# Patient Record
Sex: Female | Born: 1985 | Race: White | Hispanic: No | Marital: Single | Smoking: Current every day smoker
Health system: Southern US, Community
[De-identification: ages and names within clinical notes are randomized; demographics above are authoritative.]

## PROBLEM LIST (undated history)

## (undated) DIAGNOSIS — C73 Malignant neoplasm of thyroid gland: Secondary | ICD-10-CM

## (undated) HISTORY — PX: THYROIDECTOMY, PARTIAL: SHX18

---

## 2011-01-10 ENCOUNTER — Emergency Department (HOSPITAL_BASED_OUTPATIENT_CLINIC_OR_DEPARTMENT_OTHER)
Admission: EM | Admit: 2011-01-10 | Discharge: 2011-01-10 | Disposition: A | Discharge status: Home / Self Care | Payer: Self-pay | Attending: Emergency Medicine | Admitting: Emergency Medicine

## 2011-01-10 DIAGNOSIS — F17200 Nicotine dependence, unspecified, uncomplicated: Secondary | ICD-10-CM | POA: Insufficient documentation

## 2011-01-10 DIAGNOSIS — H6690 Otitis media, unspecified, unspecified ear: Secondary | ICD-10-CM | POA: Insufficient documentation

## 2011-01-10 DIAGNOSIS — N390 Urinary tract infection, site not specified: Secondary | ICD-10-CM | POA: Insufficient documentation

## 2011-01-10 LAB — URINALYSIS, ROUTINE W REFLEX MICROSCOPIC
Bilirubin Urine: NEGATIVE
Nitrite: POSITIVE — AB
Specific Gravity, Urine: 1.028 (ref 1.005–1.030)
Urobilinogen, UA: 0.2 mg/dL (ref 0.0–1.0)

## 2011-01-10 LAB — URINE MICROSCOPIC-ADD ON

## 2011-01-12 LAB — URINE CULTURE
Colony Count: >=100000
Culture  Setup Time: 201203290628

## 2011-04-25 ENCOUNTER — Emergency Department (HOSPITAL_BASED_OUTPATIENT_CLINIC_OR_DEPARTMENT_OTHER)
Admission: EM | Admit: 2011-04-25 | Discharge: 2011-04-25 | Disposition: A | Discharge status: Home / Self Care | Payer: PRIVATE HEALTH INSURANCE | Attending: Emergency Medicine | Admitting: Emergency Medicine

## 2011-04-25 ENCOUNTER — Encounter: Payer: Self-pay | Admitting: *Deleted

## 2011-04-25 DIAGNOSIS — S29019A Strain of muscle and tendon of unspecified wall of thorax, initial encounter: Secondary | ICD-10-CM

## 2011-04-25 DIAGNOSIS — X500XXA Overexertion from strenuous movement or load, initial encounter: Secondary | ICD-10-CM | POA: Insufficient documentation

## 2011-04-25 DIAGNOSIS — S239XXA Sprain of unspecified parts of thorax, initial encounter: Secondary | ICD-10-CM | POA: Insufficient documentation

## 2011-04-25 DIAGNOSIS — Y92009 Unspecified place in unspecified non-institutional (private) residence as the place of occurrence of the external cause: Secondary | ICD-10-CM | POA: Insufficient documentation

## 2011-04-25 MED ORDER — OXYCODONE-ACETAMINOPHEN 5-325 MG PO TABS: 1.0000 | ORAL_TABLET | ORAL | Status: AC | PRN

## 2011-04-25 MED ORDER — CYCLOBENZAPRINE HCL 10 MG PO TABS: 15.0000 mg | ORAL_TABLET | Freq: Three times a day (TID) | ORAL | Status: AC

## 2011-04-25 MED ORDER — CYCLOBENZAPRINE HCL 10 MG PO TABS
10.0000 mg | ORAL_TABLET | Freq: Once | ORAL | Status: AC
  Administered 2011-04-25: 10 mg via ORAL
  Filled 2011-04-25: qty 1

## 2011-04-25 MED ORDER — OXYCODONE-ACETAMINOPHEN 5-325 MG PO TABS
1.0000 | ORAL_TABLET | Freq: Once | ORAL | Status: AC
  Administered 2011-04-25: 1 via ORAL
  Filled 2011-04-25: qty 1

## 2011-04-25 NOTE — ED Provider Notes (Signed)
History     Chief Complaint  Patient presents with  . Back Pain   HPI Comments: Pt threw a large bag of garbage out on Sunday, 3 days ago and wrenched her back.  She has tried to take ibuprofen and use a heating pad without relief.  The pain is localized in her midback.  Patient is a 25 y.o. female presenting with back pain. The history is provided by the patient. No language interpreter was used.  Back Pain  This is a new problem. The current episode started 12 to 24 hours ago. The problem occurs constantly. The problem has not changed since onset.The pain is associated with lifting heavy objects. The pain is present in the thoracic spine. The quality of the pain is described as stabbing. The pain does not radiate. The pain is severe. The symptoms are aggravated by bending and twisting. She has tried analgesics and heat for the symptoms. The treatment provided no relief.    History reviewed. No pertinent past medical history.  History reviewed. No pertinent past surgical history.  History reviewed. No pertinent family history.  History  Substance Use Topics  . Smoking status: Current Everyday Smoker -- 0.5 packs/day  . Smokeless tobacco: Never Used  . Alcohol Use: No    OB History    Grav Para Term Preterm Abortions TAB SAB Ect Mult Living                  Review of Systems  Constitutional: Negative.   HENT: Negative.   Eyes: Negative.   Respiratory: Negative.   Cardiovascular: Negative.   Gastrointestinal: Negative.   Genitourinary: Negative.   Musculoskeletal: Positive for back pain.  Skin: Negative.   Neurological: Negative.   Psychiatric/Behavioral: Negative.     Physical Exam  BP 144/79  Pulse 76  Temp(Src) 98.1 F (36.7 C) (Oral)  Resp 20  Wt 240 lb (108.863 kg)  SpO2 99%  Physical Exam  Constitutional: She is oriented to person, place, and time. She appears well-developed and well-nourished. She appears distressed.  HENT:  Head: Normocephalic and  atraumatic.  Right Ear: External ear normal.  Left Ear: External ear normal.  Eyes: Conjunctivae and EOM are normal. Pupils are equal, round, and reactive to light.  Neck: Normal range of motion. Neck supple.  Cardiovascular: Normal rate and regular rhythm.   Pulmonary/Chest: Effort normal and breath sounds normal.  Abdominal: Soft. Bowel sounds are normal.  Musculoskeletal:       She has pain localized to the lower thoracic paraspinous muscles.  There is no bony deformity.  Neurological: She is oriented to person, place, and time.  Skin: Skin is warm and dry.  Psychiatric: She has a normal mood and affect. Her behavior is normal.    ED Course  Procedures  MDM  Pt treated for back pain with Percocet for pain and Flexeril as muscle relaxant.  Mechanism of injury did not suggest the need for imaging.    Carleene Cooper III, MD 04/25/11 2136

## 2014-11-03 ENCOUNTER — Encounter (HOSPITAL_BASED_OUTPATIENT_CLINIC_OR_DEPARTMENT_OTHER): Payer: Self-pay | Admitting: *Deleted

## 2014-11-03 ENCOUNTER — Emergency Department (HOSPITAL_BASED_OUTPATIENT_CLINIC_OR_DEPARTMENT_OTHER)
Admission: EM | Admit: 2014-11-03 | Discharge: 2014-11-03 | Disposition: A | Discharge status: Home / Self Care | Payer: Medicaid Other | Attending: Emergency Medicine | Admitting: Emergency Medicine

## 2014-11-03 DIAGNOSIS — J029 Acute pharyngitis, unspecified: Secondary | ICD-10-CM | POA: Diagnosis present

## 2014-11-03 DIAGNOSIS — Z85850 Personal history of malignant neoplasm of thyroid: Secondary | ICD-10-CM | POA: Diagnosis not present

## 2014-11-03 DIAGNOSIS — J020 Streptococcal pharyngitis: Secondary | ICD-10-CM | POA: Diagnosis not present

## 2014-11-03 DIAGNOSIS — Z720 Tobacco use: Secondary | ICD-10-CM | POA: Diagnosis not present

## 2014-11-03 DIAGNOSIS — Z79899 Other long term (current) drug therapy: Secondary | ICD-10-CM | POA: Diagnosis not present

## 2014-11-03 DIAGNOSIS — M791 Myalgia: Secondary | ICD-10-CM | POA: Diagnosis not present

## 2014-11-03 HISTORY — DX: Malignant neoplasm of thyroid gland: C73

## 2014-11-03 LAB — RAPID STREP SCREEN (MED CTR MEBANE ONLY): STREPTOCOCCUS, GROUP A SCREEN (DIRECT): POSITIVE — AB

## 2014-11-03 MED ORDER — PENICILLIN G BENZATHINE 1200000 UNIT/2ML IM SUSP
1.2000 10*6.[IU] | Freq: Once | INTRAMUSCULAR | Status: AC
  Administered 2014-11-03: 1.2 10*6.[IU] via INTRAMUSCULAR
  Filled 2014-11-03: qty 2

## 2014-11-03 NOTE — Discharge Instructions (Signed)

## 2014-11-03 NOTE — ED Notes (Signed)
Pt c/o sore throat x 1 day- exposed to strep on Sunday

## 2014-11-03 NOTE — ED Provider Notes (Signed)
CSN: [id number]     Arrival date & time 11/03/14  1022 History   First MD Initiated Contact with Patient 11/03/14 1136     Chief Complaint  Patient presents with  . Sore Throat     (Consider location/radiation/quality/duration/timing/severity/associated sxs/prior Treatment) HPI Comments: Patient presents with sore throat. She had a recent exposure to strep throat in complains of a two-day history of a sore throat and myalgias. She's had some subjective fevers. She has a little bit of runny nose and postnasal drip. She denies any shortness of breath. She denies any vomiting or diarrhea.  Patient is a 29 y.o. female presenting with pharyngitis.  Sore Throat Pertinent negatives include no chest pain, no abdominal pain, no headaches and no shortness of breath.    Past Medical History  Diagnosis Date  . Thyroid cancer    Past Surgical History  Procedure Laterality Date  . Thyroidectomy, partial     No family history on file. History  Substance Use Topics  . Smoking status: Current Every Day Smoker -- 0.50 packs/day    Types: Cigarettes  . Smokeless tobacco: Never Used  . Alcohol Use: No   OB History    No data available     Review of Systems  Constitutional: Positive for fatigue. Negative for fever, chills and diaphoresis.  HENT: Positive for congestion, postnasal drip and sore throat. Negative for rhinorrhea and sneezing.   Eyes: Negative.   Respiratory: Negative for cough, chest tightness and shortness of breath.   Cardiovascular: Negative for chest pain and leg swelling.  Gastrointestinal: Negative for nausea, vomiting, abdominal pain, diarrhea and blood in stool.  Genitourinary: Negative for frequency, hematuria, flank pain and difficulty urinating.  Musculoskeletal: Positive for myalgias and back pain. Negative for arthralgias.  Skin: Negative for rash.  Neurological: Negative for dizziness, speech difficulty, weakness, numbness and headaches.      Allergies   Review of patient's allergies indicates no known allergies.  Home Medications   Prior to Admission medications   Medication Sig Start Date End Date Taking? Authorizing Provider  levothyroxine (SYNTHROID, LEVOTHROID) 112 MCG tablet Take 112 mcg by mouth daily before breakfast.   Yes Historical Provider, MD  methadone (DOLOPHINE) 10 MG tablet Take 95 mg by mouth daily.   Yes Historical Provider, MD   BP 134/71 mmHg  Pulse 88  Temp(Src) 98.3 F (36.8 C) (Oral)  Resp 16  Ht 5\' 2"  (1.575 m)  Wt 271 lb (122.925 kg)  BMI 49.55 kg/m2  SpO2 100% Physical Exam  Constitutional: She is oriented to person, place, and time. She appears well-developed and well-nourished.  HENT:  Head: Normocephalic and atraumatic.  Mouth/Throat: Oropharynx is clear and moist. No oropharyngeal exudate.  Eyes: Pupils are equal, round, and reactive to light.  Neck: Normal range of motion. Neck supple.  Cardiovascular: Normal rate, regular rhythm and normal heart sounds.   Pulmonary/Chest: Effort normal and breath sounds normal. No respiratory distress. She has no wheezes. She has no rales. She exhibits no tenderness.  Abdominal: Soft. Bowel sounds are normal. There is no tenderness. There is no rebound and no guarding.  Musculoskeletal: Normal range of motion. She exhibits no edema.  Lymphadenopathy:    She has cervical adenopathy.  Neurological: She is alert and oriented to person, place, and time.  Skin: Skin is warm and dry. No rash noted.  Psychiatric: She has a normal mood and affect.    ED Course  Procedures (including critical care time) Labs Review Labs Reviewed  RAPID STREP SCREEN - Abnormal; Notable for the following:    Streptococcus, Group A Screen (Direct) POSITIVE (*)    All other components within normal limits    Imaging Review No results found.   EKG Interpretation None      MDM   Final diagnoses:  Pharyngitis due to group A beta hemolytic Streptococci    Patient is  well-appearing with no airway compromise. There is no evidence of peritonsillar abscess. She was treated with Bicillin. She was given return precautions.    Malvin Johns, MD 11/03/14 1328

## 2014-11-03 NOTE — ED Notes (Signed)
Pt d/c with ride- no new rx given

## 2014-11-03 NOTE — ED Notes (Signed)
MD at bedside. 

## 2015-02-16 ENCOUNTER — Encounter (HOSPITAL_COMMUNITY): Payer: Self-pay

## 2015-02-16 ENCOUNTER — Emergency Department (HOSPITAL_COMMUNITY): Payer: Medicaid Other

## 2015-02-16 ENCOUNTER — Emergency Department (HOSPITAL_COMMUNITY)
Admission: EM | Admit: 2015-02-16 | Discharge: 2015-02-16 | Disposition: A | Discharge status: Home / Self Care | Payer: Medicaid Other | Attending: Emergency Medicine | Admitting: Emergency Medicine

## 2015-02-16 DIAGNOSIS — Z3202 Encounter for pregnancy test, result negative: Secondary | ICD-10-CM | POA: Diagnosis not present

## 2015-02-16 DIAGNOSIS — R1084 Generalized abdominal pain: Secondary | ICD-10-CM | POA: Diagnosis present

## 2015-02-16 DIAGNOSIS — Z79899 Other long term (current) drug therapy: Secondary | ICD-10-CM | POA: Insufficient documentation

## 2015-02-16 DIAGNOSIS — N390 Urinary tract infection, site not specified: Secondary | ICD-10-CM | POA: Insufficient documentation

## 2015-02-16 DIAGNOSIS — R109 Unspecified abdominal pain: Secondary | ICD-10-CM

## 2015-02-16 DIAGNOSIS — Z720 Tobacco use: Secondary | ICD-10-CM | POA: Diagnosis not present

## 2015-02-16 DIAGNOSIS — Z85850 Personal history of malignant neoplasm of thyroid: Secondary | ICD-10-CM | POA: Diagnosis not present

## 2015-02-16 DIAGNOSIS — R197 Diarrhea, unspecified: Secondary | ICD-10-CM | POA: Insufficient documentation

## 2015-02-16 LAB — CBC WITH DIFFERENTIAL/PLATELET
BASOS ABS: 0 10*3/uL (ref 0.0–0.1)
BASOS PCT: 1 % (ref 0–1)
Eosinophils Absolute: 0.3 10*3/uL (ref 0.0–0.7)
Eosinophils Relative: 4 % (ref 0–5)
HEMATOCRIT: 39.1 % (ref 36.0–46.0)
Hemoglobin: 12.3 g/dL (ref 12.0–15.0)
Lymphocytes Relative: 28 % (ref 12–46)
Lymphs Abs: 1.7 10*3/uL (ref 0.7–4.0)
MCH: 24.7 pg — ABNORMAL LOW (ref 26.0–34.0)
MCHC: 31.5 g/dL (ref 30.0–36.0)
MCV: 78.5 fL (ref 78.0–100.0)
MONO ABS: 0.4 10*3/uL (ref 0.1–1.0)
Monocytes Relative: 6 % (ref 3–12)
NEUTROS ABS: 3.8 10*3/uL (ref 1.7–7.7)
NEUTROS PCT: 61 % (ref 43–77)
PLATELETS: 321 10*3/uL (ref 150–400)
RBC: 4.98 MIL/uL (ref 3.87–5.11)
RDW: 14.6 % (ref 11.5–15.5)
WBC: 6.2 10*3/uL (ref 4.0–10.5)

## 2015-02-16 LAB — URINALYSIS, ROUTINE W REFLEX MICROSCOPIC
GLUCOSE, UA: NEGATIVE mg/dL
Hgb urine dipstick: NEGATIVE
KETONES UR: NEGATIVE mg/dL
Nitrite: POSITIVE — AB
PROTEIN: NEGATIVE mg/dL
Specific Gravity, Urine: 1.022 (ref 1.005–1.030)
Urobilinogen, UA: 0.2 mg/dL (ref 0.0–1.0)
pH: 5.5 (ref 5.0–8.0)

## 2015-02-16 LAB — COMPREHENSIVE METABOLIC PANEL
ALBUMIN: 3.9 g/dL (ref 3.5–5.0)
ALT: 164 U/L — ABNORMAL HIGH (ref 14–54)
ANION GAP: 9 (ref 5–15)
AST: 93 U/L — AB (ref 15–41)
Alkaline Phosphatase: 187 U/L — ABNORMAL HIGH (ref 38–126)
BILIRUBIN TOTAL: 3.4 mg/dL — AB (ref 0.3–1.2)
BUN: 11 mg/dL (ref 6–20)
CHLORIDE: 105 mmol/L (ref 101–111)
CO2: 24 mmol/L (ref 22–32)
CREATININE: 0.77 mg/dL (ref 0.44–1.00)
Calcium: 9.3 mg/dL (ref 8.9–10.3)
GFR calc Af Amer: >60 mL/min (ref >60)
Glucose, Bld: 110 mg/dL — ABNORMAL HIGH (ref 70–99)
POTASSIUM: 3.8 mmol/L (ref 3.5–5.1)
Sodium: 138 mmol/L (ref 135–145)
TOTAL PROTEIN: 8.8 g/dL — AB (ref 6.5–8.1)

## 2015-02-16 LAB — POC URINE PREG, ED: PREG TEST UR: NEGATIVE

## 2015-02-16 LAB — URINE MICROSCOPIC-ADD ON

## 2015-02-16 LAB — I-STAT TROPONIN, ED: TROPONIN I, POC: 0 ng/mL (ref 0.00–0.08)

## 2015-02-16 LAB — LIPASE, BLOOD: Lipase: 20 U/L — ABNORMAL LOW (ref 22–51)

## 2015-02-16 IMAGING — CT CT ABD-PELV W/ CM
2 of 4 series · 16 of 46 positions shown, 18 images · IV contrast (OMNIPAQUE 300)
Comparison: None.

CLINICAL DATA: 28-year-old female with generalized abdominal pain
and diarrhea for 6 days. Initial encounter.

EXAM:
CT ABDOMEN AND PELVIS WITH CONTRAST
TECHNIQUE: Multidetector CT imaging of the abdomen and pelvis was performed
using the standard protocol following bolus administration of
intravenous contrast.
CONTRAST:  100mL OMNIPAQUE IOHEXOL 300 MG/ML  SOLN

[Series 2: abd/pel with · axial · 0.74mm/px · z∈[-466,-71]mm · 13 of 87 slices shown, 15 images]
[im 4/87  soft-tissue]
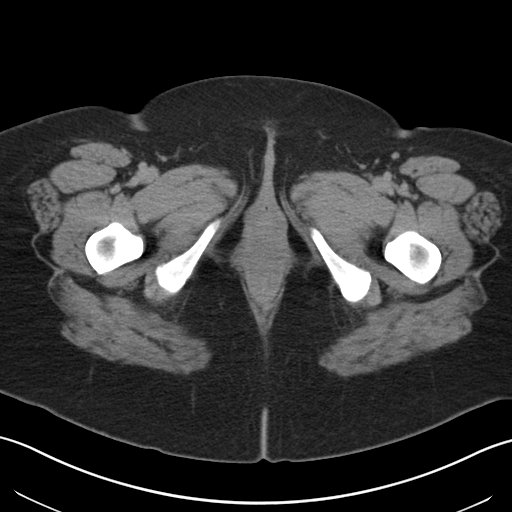
[im 4/87  bone]
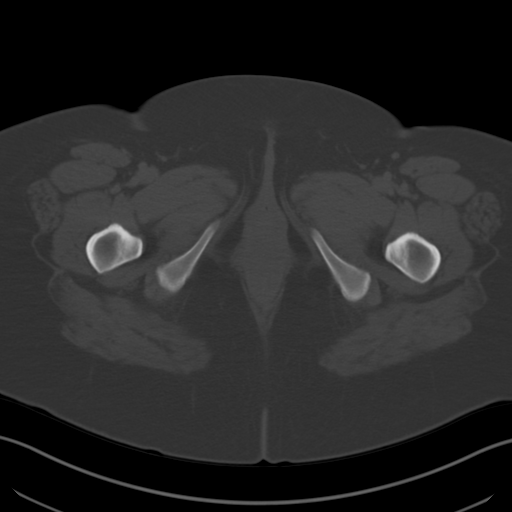
[im 11/87  soft-tissue]
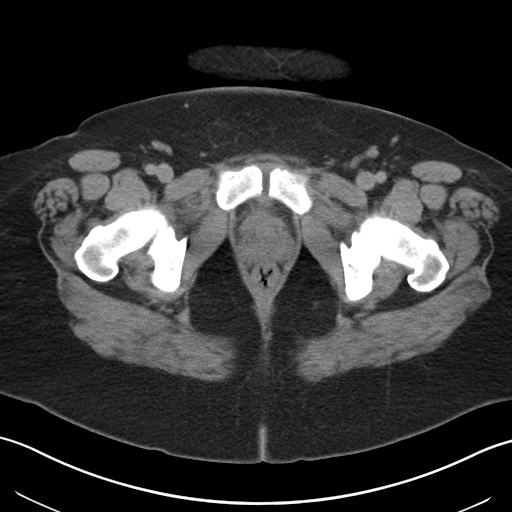
[im 18/87  soft-tissue]
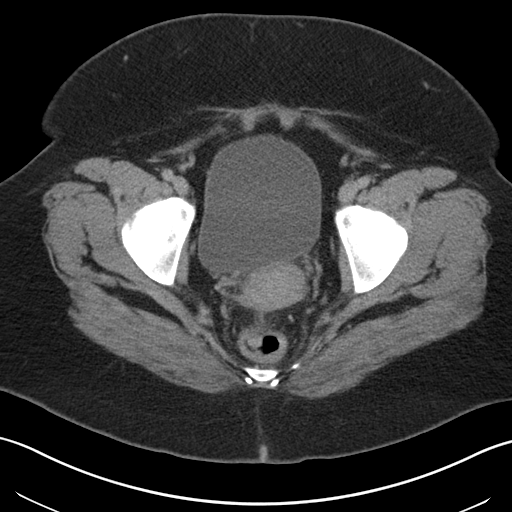
[im 26/87  soft-tissue]
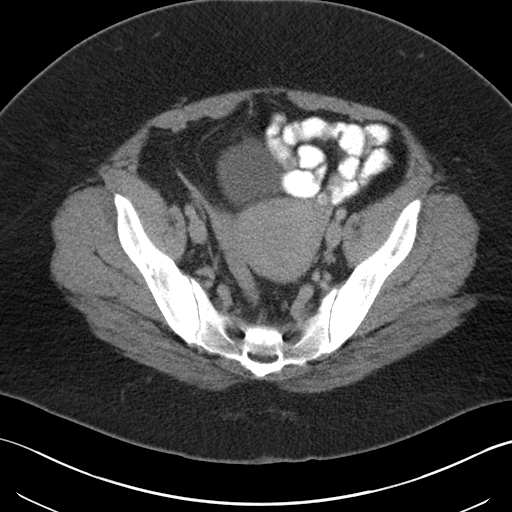
[im 29/87  soft-tissue]
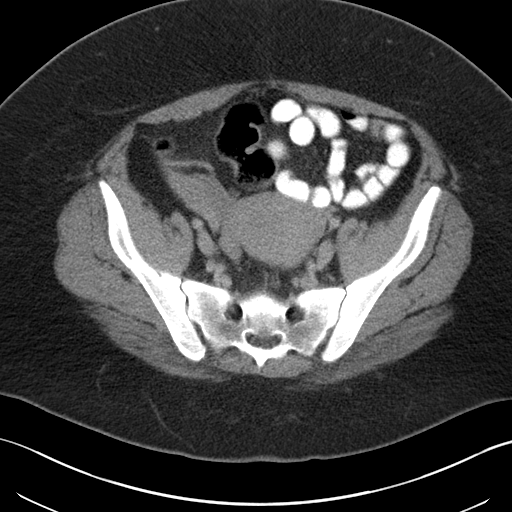
[im 36/87  soft-tissue]
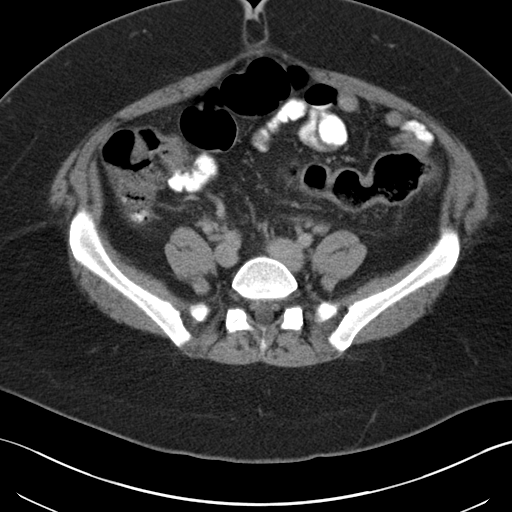
[im 44/87  soft-tissue]
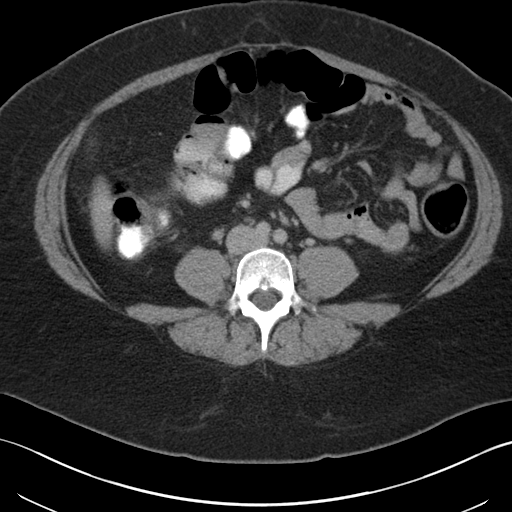
[im 51/87  soft-tissue]
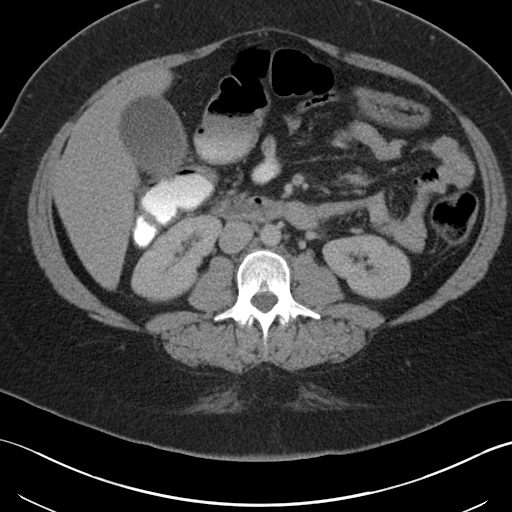
[im 58/87  soft-tissue]
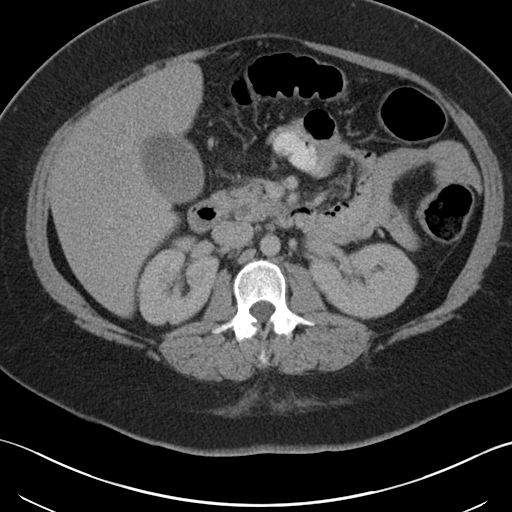
[im 58/87  bone]
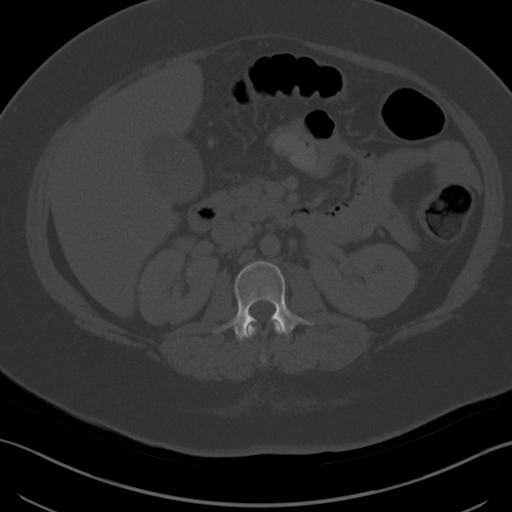
[im 61/87  soft-tissue]
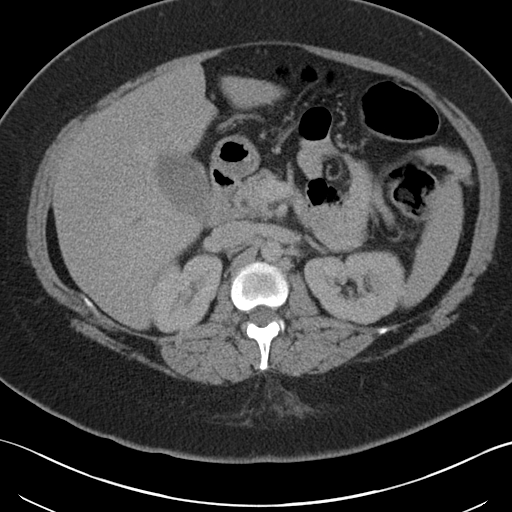
[im 69/87  soft-tissue]
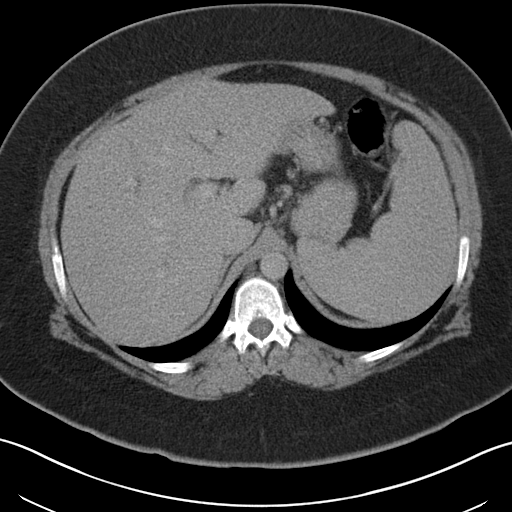
[im 76/87  soft-tissue]
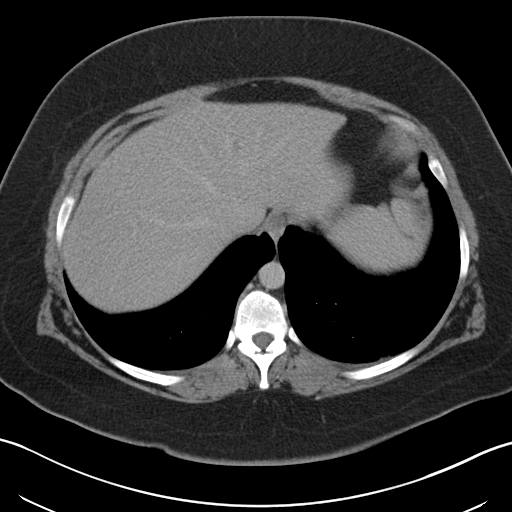
[im 83/87  soft-tissue]
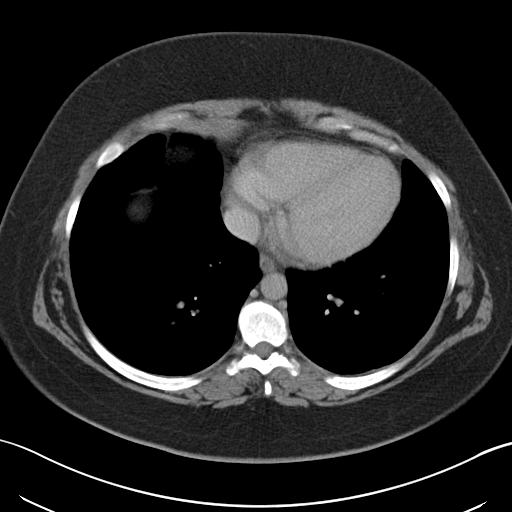

[Series 3: coronal a/|p · coronal · 0.62mm/px · 3 of 106 slices shown]
[im 36/106  soft-tissue]
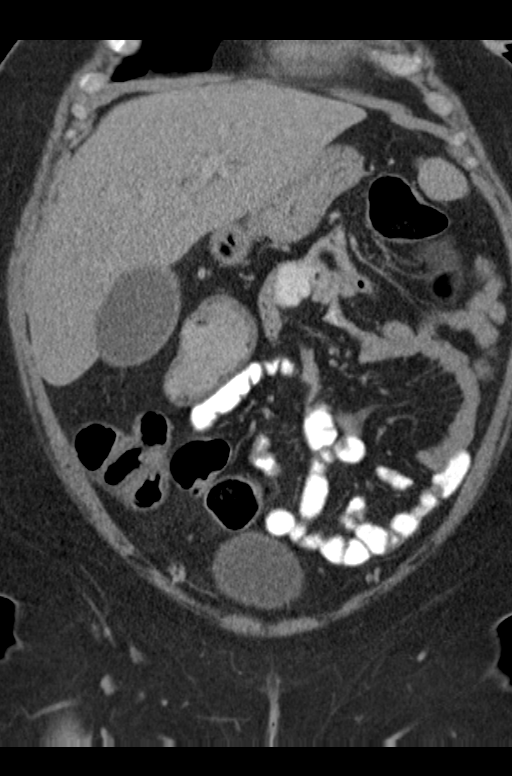
[im 47/106  soft-tissue]
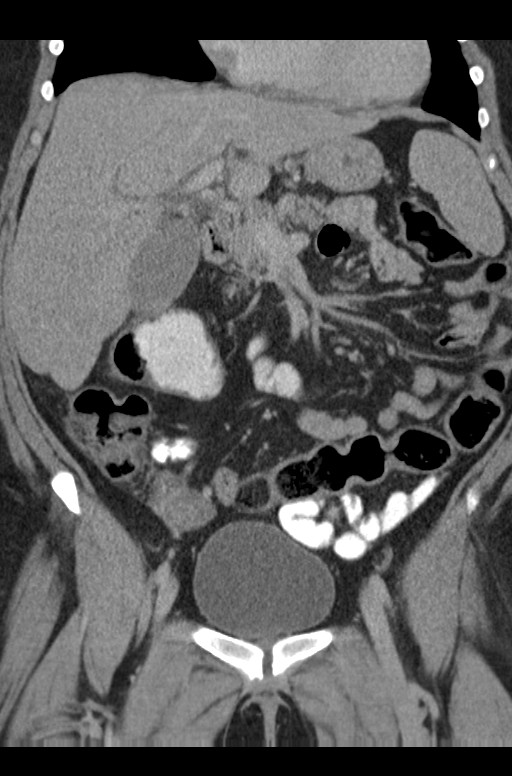
[im 59/106  soft-tissue]
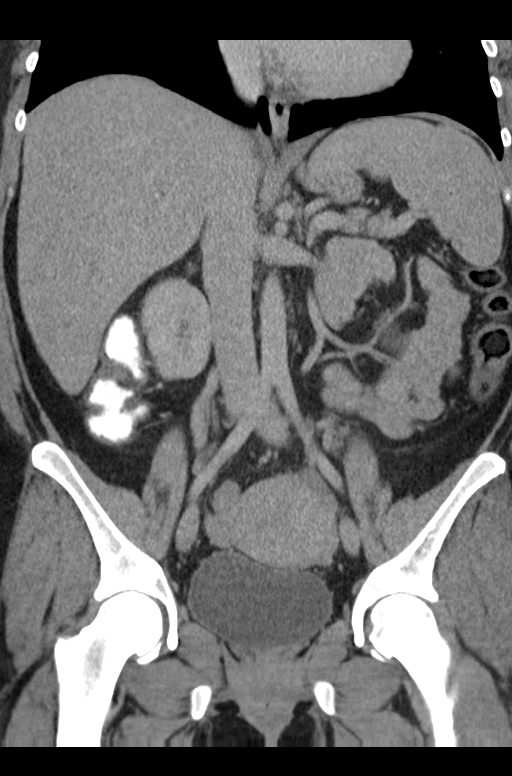

[16 of 46 positions shown; findings below may reference images not displayed]

FINDINGS: Large body habitus.

Negative lung bases.  No pericardial or pleural effusion.

No osseous abnormality identified.

No pelvic free fluid. Negative uterus and adnexa. Decompressed
rectum. Unremarkable bladder.

Redundant sigmoid colon extending above the umbilicus, and mildly
distended with gas and stool but otherwise negative.

Low-density stool continues into the left colon which is otherwise
negative. Negative transverse colon. Oral contrast has reached the
hepatic flexure. Negative right colon and appendix. Negative
terminal ileum. No dilated or inflamed small bowel loops.
Decompressed stomach and duodenum.

Liver, gallbladder, spleen, pancreas and adrenal glands are within
normal limits. Suboptimal intravascular contrast timing, no large
vessel thrombosis is evident. No abdominal free fluid. No
nephrolithiasis, hydronephrosis or perinephric stranding. Negative
course of both ureters. No lymphadenopathy.
IMPRESSION: No acute or inflammatory process identified in the abdomen or
pelvis. Normal appendix.

## 2015-02-16 MED ORDER — HYDROCODONE-ACETAMINOPHEN 5-325 MG PO TABS
1.0000 | ORAL_TABLET | Freq: Four times a day (QID) | ORAL | Status: DC | PRN
Start: 2015-02-16 — End: 2015-12-19

## 2015-02-16 MED ORDER — CIPROFLOXACIN HCL 500 MG PO TABS: 500.0000 mg | ORAL_TABLET | Freq: Two times a day (BID) | ORAL | Status: DC

## 2015-02-16 MED ORDER — IOHEXOL 300 MG/ML  SOLN
100.0000 mL | Freq: Once | INTRAMUSCULAR | Status: AC | PRN
  Administered 2015-02-16: 100 mL via INTRAVENOUS

## 2015-02-16 MED ORDER — LOPERAMIDE HCL 2 MG PO TABS: 2.0000 mg | ORAL_TABLET | Freq: Four times a day (QID) | ORAL | Status: AC | PRN

## 2015-02-16 MED ORDER — DEXTROSE 5 % IV SOLN
1.0000 g | Freq: Once | INTRAVENOUS | Status: AC
  Administered 2015-02-16: 1 g via INTRAVENOUS
  Filled 2015-02-16: qty 10

## 2015-02-16 MED ORDER — SODIUM CHLORIDE 0.9 % IV SOLN
INTRAVENOUS | Status: DC
  Administered 2015-02-16: 14:00:00 via INTRAVENOUS

## 2015-02-16 MED ORDER — SODIUM CHLORIDE 0.9 % IV BOLUS (SEPSIS)
1000.0000 mL | Freq: Once | INTRAVENOUS | Status: AC
  Administered 2015-02-16: 1000 mL via INTRAVENOUS

## 2015-02-16 MED ORDER — IOHEXOL 300 MG/ML  SOLN
50.0000 mL | Freq: Once | INTRAMUSCULAR | Status: AC | PRN
  Administered 2015-02-16: 50 mL via ORAL

## 2015-02-16 NOTE — ED Provider Notes (Signed)
CSN: [id number]     Arrival date & time 02/16/15  0908 History   First MD Initiated Contact with Patient 02/16/15 [phone]     Chief Complaint  Patient presents with  . Abdominal Pain  . Diarrhea     (Consider location/radiation/quality/duration/timing/severity/associated sxs/prior Treatment) Patient is a 29 y.o. female presenting with abdominal pain and diarrhea. The history is provided by the patient.  Abdominal Pain Associated symptoms: chills, diarrhea and fever   Associated symptoms: no chest pain, no dysuria, no nausea, no shortness of breath and no vomiting   Diarrhea Associated symptoms: abdominal pain, chills and fever   Associated symptoms: no headaches and no vomiting    patient with onset of generalized abdominal pain and diarrhea. No blood in the bowel movements. Patient states that we pain is 8 out of 10 ache in nature. Patient without any nausea or vomiting has had some fever or chills-like feelings.  Past Medical History  Diagnosis Date  . Thyroid cancer    Past Surgical History  Procedure Laterality Date  . Thyroidectomy, partial     History reviewed. No pertinent family history. History  Substance Use Topics  . Smoking status: Current Every Day Smoker -- 0.50 packs/day    Types: Cigarettes  . Smokeless tobacco: Never Used  . Alcohol Use: No   OB History    No data available     Review of Systems  Constitutional: Positive for fever and chills.  HENT: Negative for congestion.   Eyes: Negative for visual disturbance.  Respiratory: Negative for shortness of breath.   Cardiovascular: Negative for chest pain.  Gastrointestinal: Positive for abdominal pain and diarrhea. Negative for nausea and vomiting.  Genitourinary: Negative for dysuria.  Musculoskeletal: Negative for back pain.  Skin: Negative for rash.  Neurological: Negative for headaches.  Hematological: Does not bruise/bleed easily.  Psychiatric/Behavioral: Negative for confusion.      Allergies   Review of patient's allergies indicates no known allergies.  Home Medications   Prior to Admission medications   Medication Sig Start Date End Date Taking? Authorizing Provider  levothyroxine (SYNTHROID, LEVOTHROID) 112 MCG tablet Take 112 mcg by mouth daily before breakfast.   Yes Historical Provider, MD  methadone (DOLOPHINE) 10 MG/5ML solution Take 90 mg by mouth daily.   Yes Historical Provider, MD  ciprofloxacin (CIPRO) 500 MG tablet Take 1 tablet (500 mg total) by mouth 2 (two) times daily. 02/16/15   Fredia Sorrow, MD  HYDROcodone-acetaminophen (NORCO/VICODIN) 5-325 MG per tablet Take 1-2 tablets by mouth every 6 (six) hours as needed for moderate pain. 02/16/15   Fredia Sorrow, MD  loperamide (IMODIUM A-D) 2 MG tablet Take 1 tablet (2 mg total) by mouth 4 (four) times daily as needed for diarrhea or loose stools. 02/16/15   Fredia Sorrow, MD   BP 131/77 mmHg  Pulse 66  Temp(Src) 98.3 F (36.8 C) (Oral)  Resp 18  SpO2 98%  LMP 01/18/2015 (Approximate) Physical Exam  Constitutional: She is oriented to person, place, and time. She appears well-developed and well-nourished.  HENT:  Head: Normocephalic and atraumatic.  Mouth/Throat: Oropharynx is clear and moist.  Eyes: Conjunctivae and EOM are normal. Pupils are equal, round, and reactive to light.  Neck: Normal range of motion.  Cardiovascular: Normal rate, regular rhythm and normal heart sounds.   No murmur heard. Pulmonary/Chest: Effort normal and breath sounds normal. No respiratory distress.  Abdominal: Soft. Bowel sounds are normal. There is no tenderness.  Musculoskeletal: Normal range of motion.  Neurological: She  is alert and oriented to person, place, and time. No cranial nerve deficit. She exhibits normal muscle tone. Coordination normal.  Skin: Skin is warm. No rash noted. No erythema.  Nursing note and vitals reviewed.   ED Course  Procedures (including critical care time) Labs Review Labs Reviewed  CBC WITH  DIFFERENTIAL/PLATELET - Abnormal; Notable for the following:    MCH 24.7 (*)    All other components within normal limits  COMPREHENSIVE METABOLIC PANEL - Abnormal; Notable for the following:    Glucose, Bld 110 (*)    Total Protein 8.8 (*)    AST 93 (*)    ALT 164 (*)    Alkaline Phosphatase 187 (*)    Total Bilirubin 3.4 (*)    All other components within normal limits  LIPASE, BLOOD - Abnormal; Notable for the following:    Lipase 20 (*)    All other components within normal limits  URINALYSIS, ROUTINE W REFLEX MICROSCOPIC - Abnormal; Notable for the following:    Color, Urine ORANGE (*)    APPearance CLOUDY (*)    Bilirubin Urine LARGE (*)    Nitrite POSITIVE (*)    Leukocytes, UA MODERATE (*)    All other components within normal limits  URINE MICROSCOPIC-ADD ON - Abnormal; Notable for the following:    Squamous Epithelial / LPF MANY (*)    Bacteria, UA MANY (*)    All other components within normal limits  CLOSTRIDIUM DIFFICILE BY PCR  URINE CULTURE  I-STAT TROPOININ, ED  POC URINE PREG, ED   Results for orders placed or performed during the hospital encounter of 02/16/15  CBC with Differential  Result Value Ref Range   WBC 6.2 4.0 - 10.5 K/uL   RBC 4.98 3.87 - 5.11 MIL/uL   Hemoglobin 12.3 12.0 - 15.0 g/dL   HCT 39.1 36.0 - 46.0 %   MCV 78.5 78.0 - 100.0 fL   MCH 24.7 (L) 26.0 - 34.0 pg   MCHC 31.5 30.0 - 36.0 g/dL   RDW 14.6 11.5 - 15.5 %   Platelets 321 150 - 400 K/uL   Neutrophils Relative % 61 43 - 77 %   Neutro Abs 3.8 1.7 - 7.7 K/uL   Lymphocytes Relative 28 12 - 46 %   Lymphs Abs 1.7 0.7 - 4.0 K/uL   Monocytes Relative 6 3 - 12 %   Monocytes Absolute 0.4 0.1 - 1.0 K/uL   Eosinophils Relative 4 0 - 5 %   Eosinophils Absolute 0.3 0.0 - 0.7 K/uL   Basophils Relative 1 0 - 1 %   Basophils Absolute 0.0 0.0 - 0.1 K/uL  Comprehensive metabolic panel  Result Value Ref Range   Sodium 138 135 - 145 mmol/L   Potassium 3.8 3.5 - 5.1 mmol/L   Chloride 105 101  - 111 mmol/L   CO2 24 22 - 32 mmol/L   Glucose, Bld 110 (H) 70 - 99 mg/dL   BUN 11 6 - 20 mg/dL   Creatinine, Ser 0.77 0.44 - 1.00 mg/dL   Calcium 9.3 8.9 - 10.3 mg/dL   Total Protein 8.8 (H) 6.5 - 8.1 g/dL   Albumin 3.9 3.5 - 5.0 g/dL   AST 93 (H) 15 - 41 U/L   ALT 164 (H) 14 - 54 U/L   Alkaline Phosphatase 187 (H) 38 - 126 U/L   Total Bilirubin 3.4 (H) 0.3 - 1.2 mg/dL   GFR calc non Af Amer >60 >60 mL/min   GFR calc Af  Amer >60 >60 mL/min   Anion gap 9 5 - 15  Lipase, blood  Result Value Ref Range   Lipase 20 (L) 22 - 51 U/L  Urinalysis, Routine w reflex microscopic  Result Value Ref Range   Color, Urine ORANGE (A) YELLOW   APPearance CLOUDY (A) CLEAR   Specific Gravity, Urine 1.022 1.005 - 1.030   pH 5.5 5.0 - 8.0   Glucose, UA NEGATIVE NEGATIVE mg/dL   Hgb urine dipstick NEGATIVE NEGATIVE   Bilirubin Urine LARGE (A) NEGATIVE   Ketones, ur NEGATIVE NEGATIVE mg/dL   Protein, ur NEGATIVE NEGATIVE mg/dL   Urobilinogen, UA 0.2 0.0 - 1.0 mg/dL   Nitrite POSITIVE (A) NEGATIVE   Leukocytes, UA MODERATE (A) NEGATIVE  Urine microscopic-add on  Result Value Ref Range   Squamous Epithelial / LPF MANY (A) RARE   WBC, UA 7-10 <3 WBC/hpf   RBC / HPF 3-6 <3 RBC/hpf   Bacteria, UA MANY (A) RARE   Urine-Other MUCOUS PRESENT   I-stat troponin, ED (only if pt is 27 y.o. or older & pain is above umbilicus)  not at [facility]  Result Value Ref Range   Troponin i, poc 0.00 0.00 - 0.08 ng/mL   Comment 3          POC Urine Pregnancy, ED  (If Pre-menopausal female)  not at [facility]  Result Value Ref Range   Preg Test, Ur NEGATIVE NEGATIVE     Imaging Review Ct Abdomen Pelvis W Contrast  02/16/2015   CLINICAL DATA:  29 year old female with generalized abdominal pain and diarrhea for 6 days. Initial encounter.  EXAM: CT ABDOMEN AND PELVIS WITH CONTRAST  TECHNIQUE: Multidetector CT imaging of the abdomen and pelvis was performed using the standard protocol following bolus administration of  intravenous contrast.  CONTRAST:  166mL OMNIPAQUE IOHEXOL 300 MG/ML  SOLN  COMPARISON:  None.  FINDINGS: Large body habitus.  Negative lung bases.  No pericardial or pleural effusion.  No osseous abnormality identified.  No pelvic free fluid. Negative uterus and adnexa. Decompressed rectum. Unremarkable bladder.  Redundant sigmoid colon extending above the umbilicus, and mildly distended with gas and stool but otherwise negative.  Low-density stool continues into the left colon which is otherwise negative. Negative transverse colon. Oral contrast has reached the hepatic flexure. Negative right colon and appendix. Negative terminal ileum. No dilated or inflamed small bowel loops. Decompressed stomach and duodenum.  Liver, gallbladder, spleen, pancreas and adrenal glands are within normal limits. Suboptimal intravascular contrast timing, no large vessel thrombosis is evident. No abdominal free fluid. No nephrolithiasis, hydronephrosis or perinephric stranding. Negative course of both ureters. No lymphadenopathy.  IMPRESSION: No acute or inflammatory process identified in the abdomen or pelvis. Normal appendix.   Electronically Signed   By: Genevie Ann M.D.   On: 02/16/2015 12:58     EKG Interpretation None      MDM   Final diagnoses:  Abdominal pain  Diarrhea  UTI (lower urinary tract infection)  Hyperbilirubinemia    Patient presented with a complaint of abdominal pain and diarrhea this been going on for 6 days. No vomiting., Pain is been generalized. Workup here CT scan of the abdomen without any acute findings. No leukocytosis. But does have liver function test abnormalities in particular bilirubin is elevated not able to explain. In addition urinalysis had positive nitrites of most likely there is a urinary tract infection ongoing. Urine sent for culture patient treated with 1 g of Rocephin here and be continued  on Cipro. May help with the diarrhea even though I think the diarrhea is probably viral in  nature. Patient given GI medicine for follow-up of hyperbilirubinemia and also given resource guide to find a primary care doctor.  Patient initially treated with Imodium ad as well as Cipro as mentioned above. Hydrocodone as needed for pain.    Fredia Sorrow, MD 02/16/15 [phone]

## 2015-02-16 NOTE — Discharge Instructions (Signed)
Referral information provided for GI medicine. Give him a call for follow-up check if the diarrhea does not clear. Also will need follow-up because your bilirubin level was elevated needs to be rechecked to make sure it comes back down to normal. Take the Imodium right ear as directed. Take the Cipro as directed for the urinary tract infection.  Resource guide provided below to help you find a primary care doctor.   Emergency Department Resource Guide 1) Find a Doctor and Pay Out of Pocket Although you won't have to find out who is covered by your insurance plan, it is a good idea to ask around and get recommendations. You will then need to call the office and see if the doctor you have chosen will accept you as a new patient and what types of options they offer for patients who are self-pay. Some doctors offer discounts or will set up payment plans for their patients who do not have insurance, but you will need to ask so you aren't surprised when you get to your appointment.  2) Contact Your Local Health Department Not all health departments have doctors that can see patients for sick visits, but many do, so it is worth a call to see if yours does. If you don't know where your local health department is, you can check in your phone book. The CDC also has a tool to help you locate your state's health department, and many state websites also have listings of all of their local health departments.  3) Find a [facility] If your illness is not likely to be very severe or complicated, you may want to try a walk in clinic. These are popping up all over the country in pharmacies, drugstores, and shopping centers. They're usually staffed by nurse practitioners or physician assistants that have been trained to treat common illnesses and complaints. They're usually fairly quick and inexpensive. However, if you have serious medical issues or chronic medical problems, these are probably not your best  option.  No Primary Care Doctor: - Call Health Connect at  [phone] - they can help you locate a primary care doctor that  accepts your insurance, provides certain services, etc. - Physician Referral Service- [phone]  Chronic Pain Problems: Organization         Address  Phone   Notes  [facility]  [phone] Patients need to be referred by their primary care doctor.   Medication Assistance: Organization         Address  Phone   Notes  [facility] Medication [facility] [address] [place], [place] [address] [phone] --Must be a resident of [facility] -- Must have NO insurance coverage whatsoever (no Medicaid/ Medicare, etc.) -- The pt. MUST have a primary care doctor that directs their care regularly and follows them in the community   Med[facility]ist  [phone]   [employer]  [phone]    Agencies that provide inexpensive medical care: Organization         Address  Phone   Notes  [place]  [phone]   [facility]    [phone]   [facility] [place], [place] [address] [phone]   [place] [address], [place] [phone]   Planned Parenthood    [phone]   [facility]    [phone]   Community Health  and [address], [place] Phone:  [phone], Fax:  [phone] Hours of Operation:  9 am - 6 pm, M-F.  Also accepts Medicaid/Medicare and self-pay.  [facility] for [address] [place], [address], [place] Phone: [phone], Fax: [phone]. Hours of Operation:  8:30 am - 5:30 pm, M-F.  Also accepts Medicaid and self-pay.  [facility] [place] [address], [place] Phone: [phone]   [place], [place], [place] [phone], Ext. [phone] Mondays & Thursdays: 7-9 AM.  First 15  patients are seen on a first come, first serve basis.    [place] Providers:  Organization         Address  Phone   Notes  [facility] [address], [place] [phone] Also accepts self-pay patients.  [facility] [id number] [place], [place]  [phone]   [address], [place] [phone]   [facility] [address] [place] [phone]   Lucianne Lei [address], [place]   [phone] Only accepts [place] Access [place] patients after they have their name applied to their card.   Self-Pay (no insurance) in [facility]:  Organization         Address  Phone   Notes  Sickle Cell Patients, [facility] Internal Medicine [place] [phone]   [facility] [place] [phone]   [facility] [place]  [place], [place], [place] [phone]   [facility]/Dr. Osei-Bonsu  [address] [place] or [address], [place] [phone] Phone number for both [place] and [place] locations is the same.  [facility] [address] [place] [phone]   [facility] [address] [place] or [address] [phone]   [facility] [address] [place] [phone], phone; [phone], fax Sees patients 1st and 3rd Saturday of every month.  Must not qualify for public or private insurance (i.e. Medicaid, Medicare, [place] Health Choice, Veterans' Benefits)  Household income should be no more than 200% of the poverty level The clinic cannot treat you if you are pregnant or think you are pregnant  Sexually transmitted diseases are not treated at the clinic.    Dental  Care: Organization         Address  Phone  Notes  [facility] Department of [facility] [place] [phone] Accepts children up to age 36 who are enrolled in [place] or [place]; pregnant women with a Medicaid card; and children who have applied for Medicaid or [place] Health Choice, but were declined, whose parents can pay a reduced fee at time of service.  [facility] Department of [facility]  [address], [place] [phone] Accepts children up to age 37 who are enrolled in [place] or [place]; pregnant women with a Medicaid card; and children who have applied for Medicaid or [place] Health Choice, but were declined, whose parents can pay a reduced fee at time of service.  [facility]  [address]  Mardene Speak [phone] Patients are seen by appointment only. Walk-ins are not accepted. [place] will see patients 41 years of age and older. Monday - Tuesday (8am-5pm) Most Wednesdays (8:30-5pm) $30 per visit, cash only  [facility] Adult Dental Access PROGRAM  [address], [facility] [phone] Patients are seen by appointment only. Walk-ins are not accepted. [place] will see patients 39 years of age and older. One Wednesday Evening (Monthly: Volunteer Based).  $30 per visit, cash only  [place]  [phone] for adults; Children under age 55, call Graduate Pediatric Dentistry at [phone]. Children aged 51-14, please call [phone] to request a pediatric application.  Dental services are provided in all areas of dental care including fillings, crowns and bridges, complete and partial dentures, implants, gum treatment, root canals, and extractions. Preventive care is also provided. Treatment is provided to both adults and children. Patients are selected via a lottery and there is often a waiting list.   [facility] [address], [place]  [phone] [email or web address]   [facility] [address] [place], [place] [phone], Ext. [phone] Second and Fourth Thursday of each month, opens at 6:30 AM; Clinic ends at 9 AM.  Patients are seen on a first-come first-served basis, and a limited number are seen during each clinic.   [facility]  [address] Hillard Danker [place], [place] [phone]   Eligibility Requirements You must have lived in [place], [place], or [place] counties for at least the last three months.   You cannot be eligible for state or federal sponsored [employer], including [employer], [place], or [employer].   You generally cannot be eligible for healthcare insurance through your employer.    How to apply: Eligibility screenings are held every Tuesday and Wednesday afternoon from 1:00 pm until 4:00 pm. You do not need an appointment for the interview!  [facility] [address], [place], [place]   [place]  [place] Department  [place]  [phone]    Behavioral Health Resources in the Community: Intensive Outpatient Programs Organization         Address  Phone  Notes  [place]. [address], [place], [place] [phone]   [facility] Outpatient [address] [place], [place]   ADS: Alcohol & Drug Svcs [address], [place], [place]   [place] [address]  [place], [place] or [phone]   Substance Abuse Resources Organization         Address  Phone  Notes  Alcohol and Drug Services  [phone]   [place]  [phone]   [facility] [place]   Chinita Pester  [phone]   Residential & Outpatient Substance Abuse Program  [phone]    Psychological Services Organization         Address  Phone  Notes  [facility] [place]  [place]  [phone]   [place] [address] [place] or [phone]    Mobile Crisis Teams Organization         Address  Phone  Notes  [facility], Mobile Crisis Care Unit  [phone]   Assertive Psychotherapeutic Services  [address] [place], [place]   [place]  Interior [phone]    Self-Help/Support Groups Organization         Address  Phone             Notes  [facility] [place] - variety of support groups  [place] Call for more information  Narcotics Anonymous (NA), [facility] [address], [employer] [place]  2 meetings at this location   Special educational needs teacher         Address  Phone  Notes  [facility] Residential Treatment [place],    [place]  [phone]   [facility]  [address], [place] [id number], [place], [place]   [place], [place] [phone] Admissions: 8am-3pm M-F  Incentives Substance [place] [address]    [place], [place] [phone]   [facility] [address] [place], [place], [place]   [facility] [address]  [place], [place]   [facility] - Intensive Outpatient [address], [place], [place]   [facility] ([place].) [place].,  [place], [place] [phone] or [phone]   [facility] Services (RTS) [address], [place], [place] Accepts Medicaid  [facility] [place] [address]  [place] 1-2280100179 Substance Abuse/Addiction Treatment   [facility] Organization         Address  Phone  Notes  [facility]  [phone]   Domenic Schwab, PhD [address] Arlis [place], [place]   [phone] or [phone]   Martin Buckley [address] [place], [place] [phone]   [facility] [address] [place], [place] [phone] Insurance/Medicaid/sponsorship through [facility] and Families [address], [address] [place]                                    [place], [place] [phone] [place] [address], [place] [phone]    Dr. Adele Schilder  [phone]   [facility] Dept. 1) [address] [place] 2) [place] 3)  [place] 65, [place] [phone]  [phone]   [place] [phone] or [phone] (After Hours)

## 2015-02-16 NOTE — ED Notes (Addendum)
Pt c/o generalized abdominal pain and diarrhea x 6 days.  Pain score 8/10.  Pt reports that eating makes pain worse.  Pt had twins in December 2015 and never followed up.  Sts she took Pepto w/o relief.  LMP was last month and Pt is unsure, if she is pregnant.  Hx of thyroid CA.  Sts surgery to remove half of thyroid in Sept 2015.

## 2015-02-18 LAB — URINE CULTURE: Colony Count: >=100000

## 2015-02-19 ENCOUNTER — Telehealth: Payer: Self-pay | Admitting: Emergency Medicine

## 2015-02-19 NOTE — Telephone Encounter (Signed)
Post ED Visit - Positive Culture Follow-up  Culture report reviewed by antimicrobial stewardship pharmacist: []  Wes Dulaney, Pharm.D., BCPS []  Heide Guile, Pharm.D., BCPS []  Alycia Rossetti, Pharm.D., BCPS []  [place], Pharm.D., BCPS, AAHIVP []  Legrand Como, Pharm.D., BCPS, AAHIVP [x]  Sherlon Handing, Pharm.D., BCPS  Positive Urine culture Treated with Ciprofloxacin, organism sensitive to the same and no further patient follow-up is required at this time.  Ernesta Amble 02/19/2015, 3:00 PM

## 2015-12-18 ENCOUNTER — Encounter (HOSPITAL_BASED_OUTPATIENT_CLINIC_OR_DEPARTMENT_OTHER): Payer: Self-pay | Admitting: Emergency Medicine

## 2015-12-18 ENCOUNTER — Emergency Department (HOSPITAL_BASED_OUTPATIENT_CLINIC_OR_DEPARTMENT_OTHER)
Admission: EM | Admit: 2015-12-18 | Discharge: 2015-12-19 | Disposition: A | Discharge status: Home / Self Care | Payer: Medicaid Other | Attending: Emergency Medicine | Admitting: Emergency Medicine

## 2015-12-18 ENCOUNTER — Emergency Department (HOSPITAL_BASED_OUTPATIENT_CLINIC_OR_DEPARTMENT_OTHER): Payer: Medicaid Other

## 2015-12-18 DIAGNOSIS — M79602 Pain in left arm: Secondary | ICD-10-CM | POA: Diagnosis not present

## 2015-12-18 DIAGNOSIS — D649 Anemia, unspecified: Secondary | ICD-10-CM | POA: Insufficient documentation

## 2015-12-18 DIAGNOSIS — Z792 Long term (current) use of antibiotics: Secondary | ICD-10-CM | POA: Insufficient documentation

## 2015-12-18 DIAGNOSIS — F419 Anxiety disorder, unspecified: Secondary | ICD-10-CM

## 2015-12-18 DIAGNOSIS — Z79899 Other long term (current) drug therapy: Secondary | ICD-10-CM | POA: Insufficient documentation

## 2015-12-18 DIAGNOSIS — Z3202 Encounter for pregnancy test, result negative: Secondary | ICD-10-CM | POA: Diagnosis not present

## 2015-12-18 DIAGNOSIS — F17210 Nicotine dependence, cigarettes, uncomplicated: Secondary | ICD-10-CM | POA: Insufficient documentation

## 2015-12-18 DIAGNOSIS — R062 Wheezing: Secondary | ICD-10-CM | POA: Diagnosis not present

## 2015-12-18 DIAGNOSIS — Z85850 Personal history of malignant neoplasm of thyroid: Secondary | ICD-10-CM | POA: Insufficient documentation

## 2015-12-18 DIAGNOSIS — Z79891 Long term (current) use of opiate analgesic: Secondary | ICD-10-CM | POA: Diagnosis not present

## 2015-12-18 DIAGNOSIS — R0789 Other chest pain: Secondary | ICD-10-CM | POA: Insufficient documentation

## 2015-12-18 DIAGNOSIS — R079 Chest pain, unspecified: Secondary | ICD-10-CM | POA: Diagnosis present

## 2015-12-18 LAB — CBC WITH DIFFERENTIAL/PLATELET
BASOS ABS: 0.1 10*3/uL (ref 0.0–0.1)
BASOS PCT: 1 %
EOS ABS: 0.6 10*3/uL (ref 0.0–0.7)
Eosinophils Relative: 7 %
HCT: 21.4 % — ABNORMAL LOW (ref 36.0–46.0)
HEMOGLOBIN: 5.7 g/dL — AB (ref 12.0–15.0)
LYMPHS PCT: 35 %
Lymphs Abs: 2.8 10*3/uL (ref 0.7–4.0)
MCH: 18.5 pg — AB (ref 26.0–34.0)
MCHC: 26.6 g/dL — ABNORMAL LOW (ref 30.0–36.0)
MCV: 69.5 fL — ABNORMAL LOW (ref 78.0–100.0)
MONO ABS: 0.4 10*3/uL (ref 0.1–1.0)
Monocytes Relative: 5 %
NEUTROS PCT: 52 %
Neutro Abs: 4.1 10*3/uL (ref 1.7–7.7)
PLATELETS: 293 10*3/uL (ref 150–400)
RBC: 3.08 MIL/uL — AB (ref 3.87–5.11)
RDW: 19 % — ABNORMAL HIGH (ref 11.5–15.5)
WBC: 8 10*3/uL (ref 4.0–10.5)

## 2015-12-18 LAB — BASIC METABOLIC PANEL
ANION GAP: 12 (ref 5–15)
BUN: 14 mg/dL (ref 6–20)
CALCIUM: 9.2 mg/dL (ref 8.9–10.3)
CO2: 25 mmol/L (ref 22–32)
CREATININE: 0.78 mg/dL (ref 0.44–1.00)
Chloride: 101 mmol/L (ref 101–111)
Glucose, Bld: 91 mg/dL (ref 65–99)
Potassium: 3.7 mmol/L (ref 3.5–5.1)
SODIUM: 138 mmol/L (ref 135–145)

## 2015-12-18 LAB — TROPONIN I

## 2015-12-18 LAB — D-DIMER, QUANTITATIVE (NOT AT ARMC): D DIMER QUANT: 0.66 ug{FEU}/mL — AB (ref 0.00–0.50)

## 2015-12-18 IMAGING — CR DG CHEST 2V
2 series · 2 of 2 positions shown · non-contrast
Comparison: None.

CLINICAL DATA: Acute onset of arm pain described as a muscle a
eighth and then onset of intermittent chest pains about 4 hours ago.
Recent cough.

EXAM:
CHEST  2 VIEW

[w chest pa]
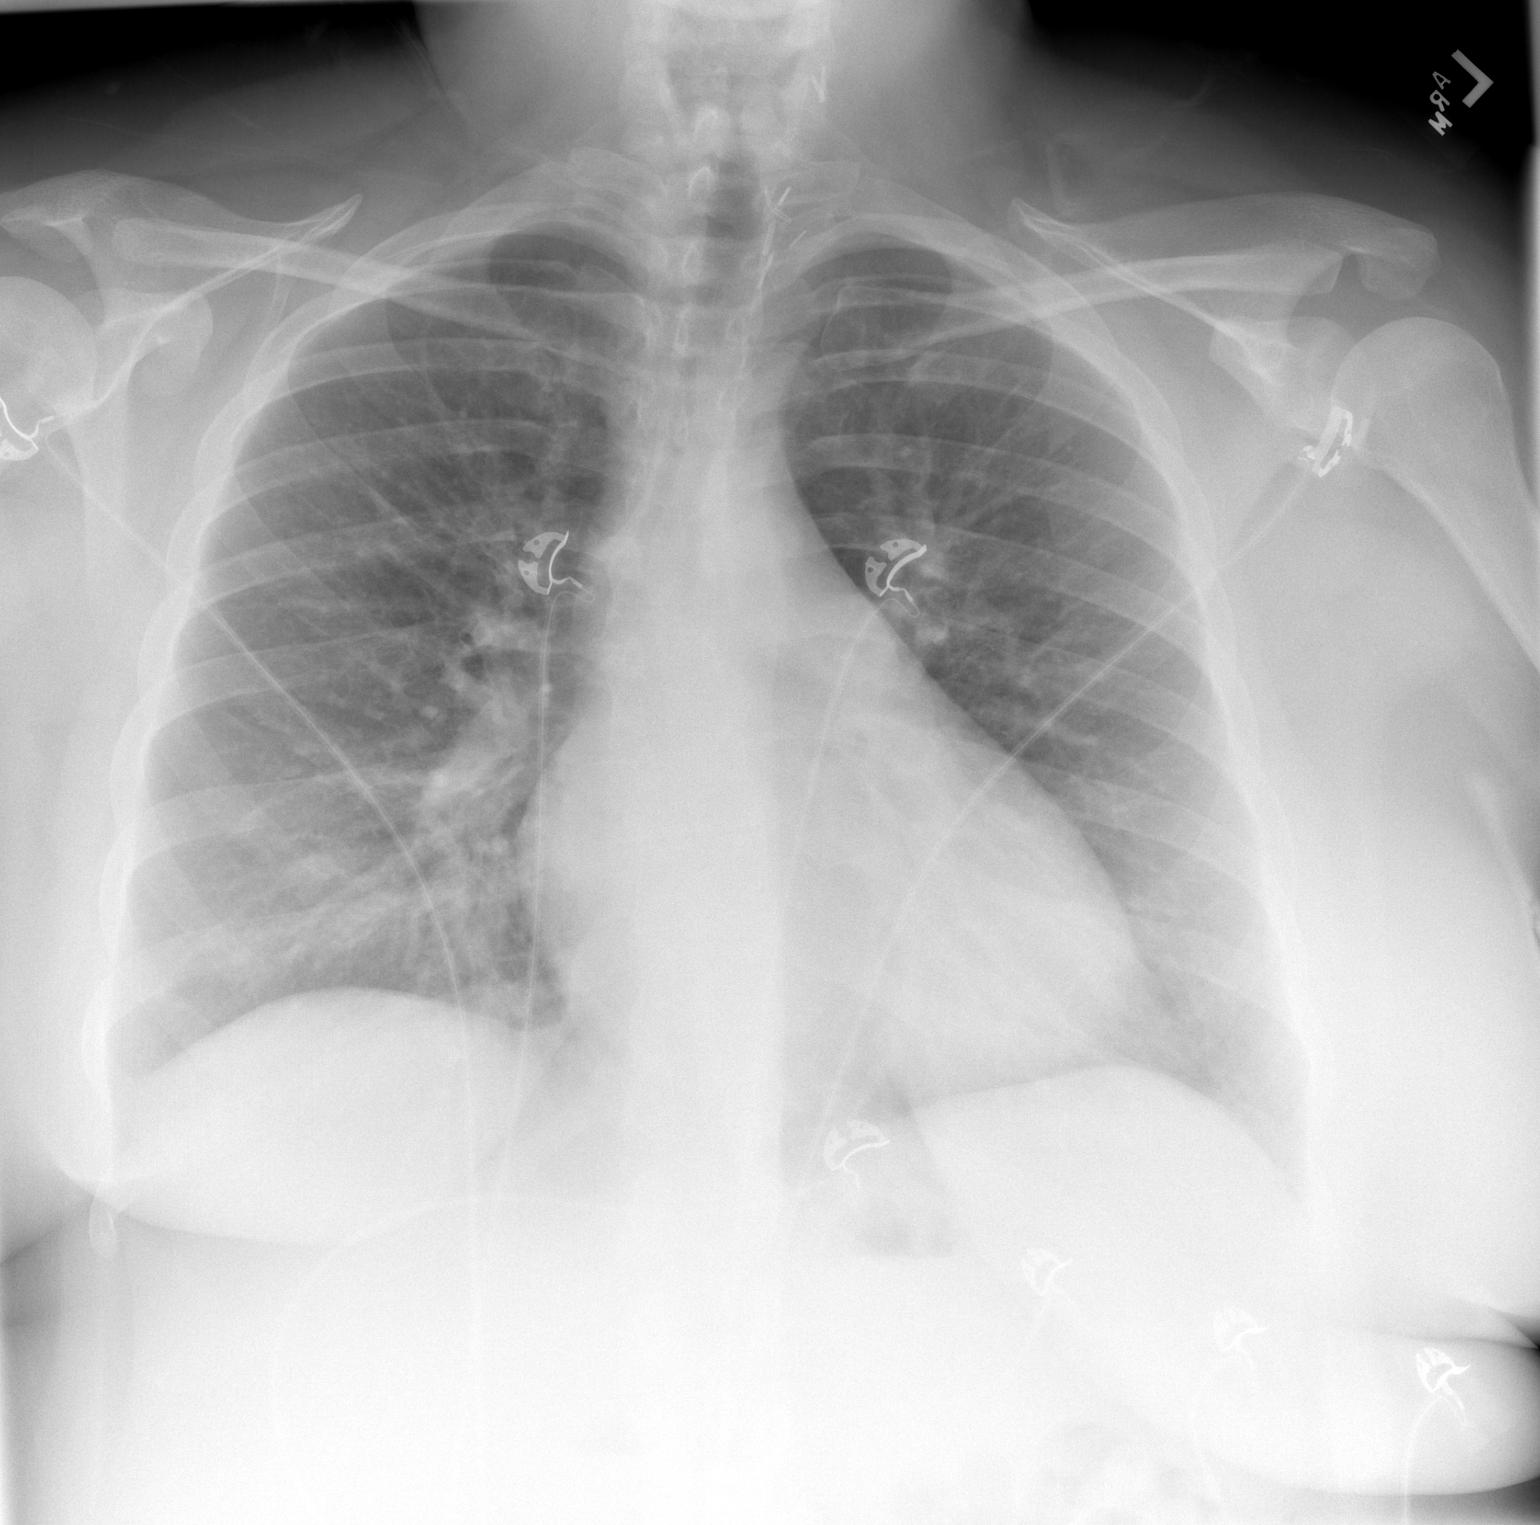

[w chest lat]
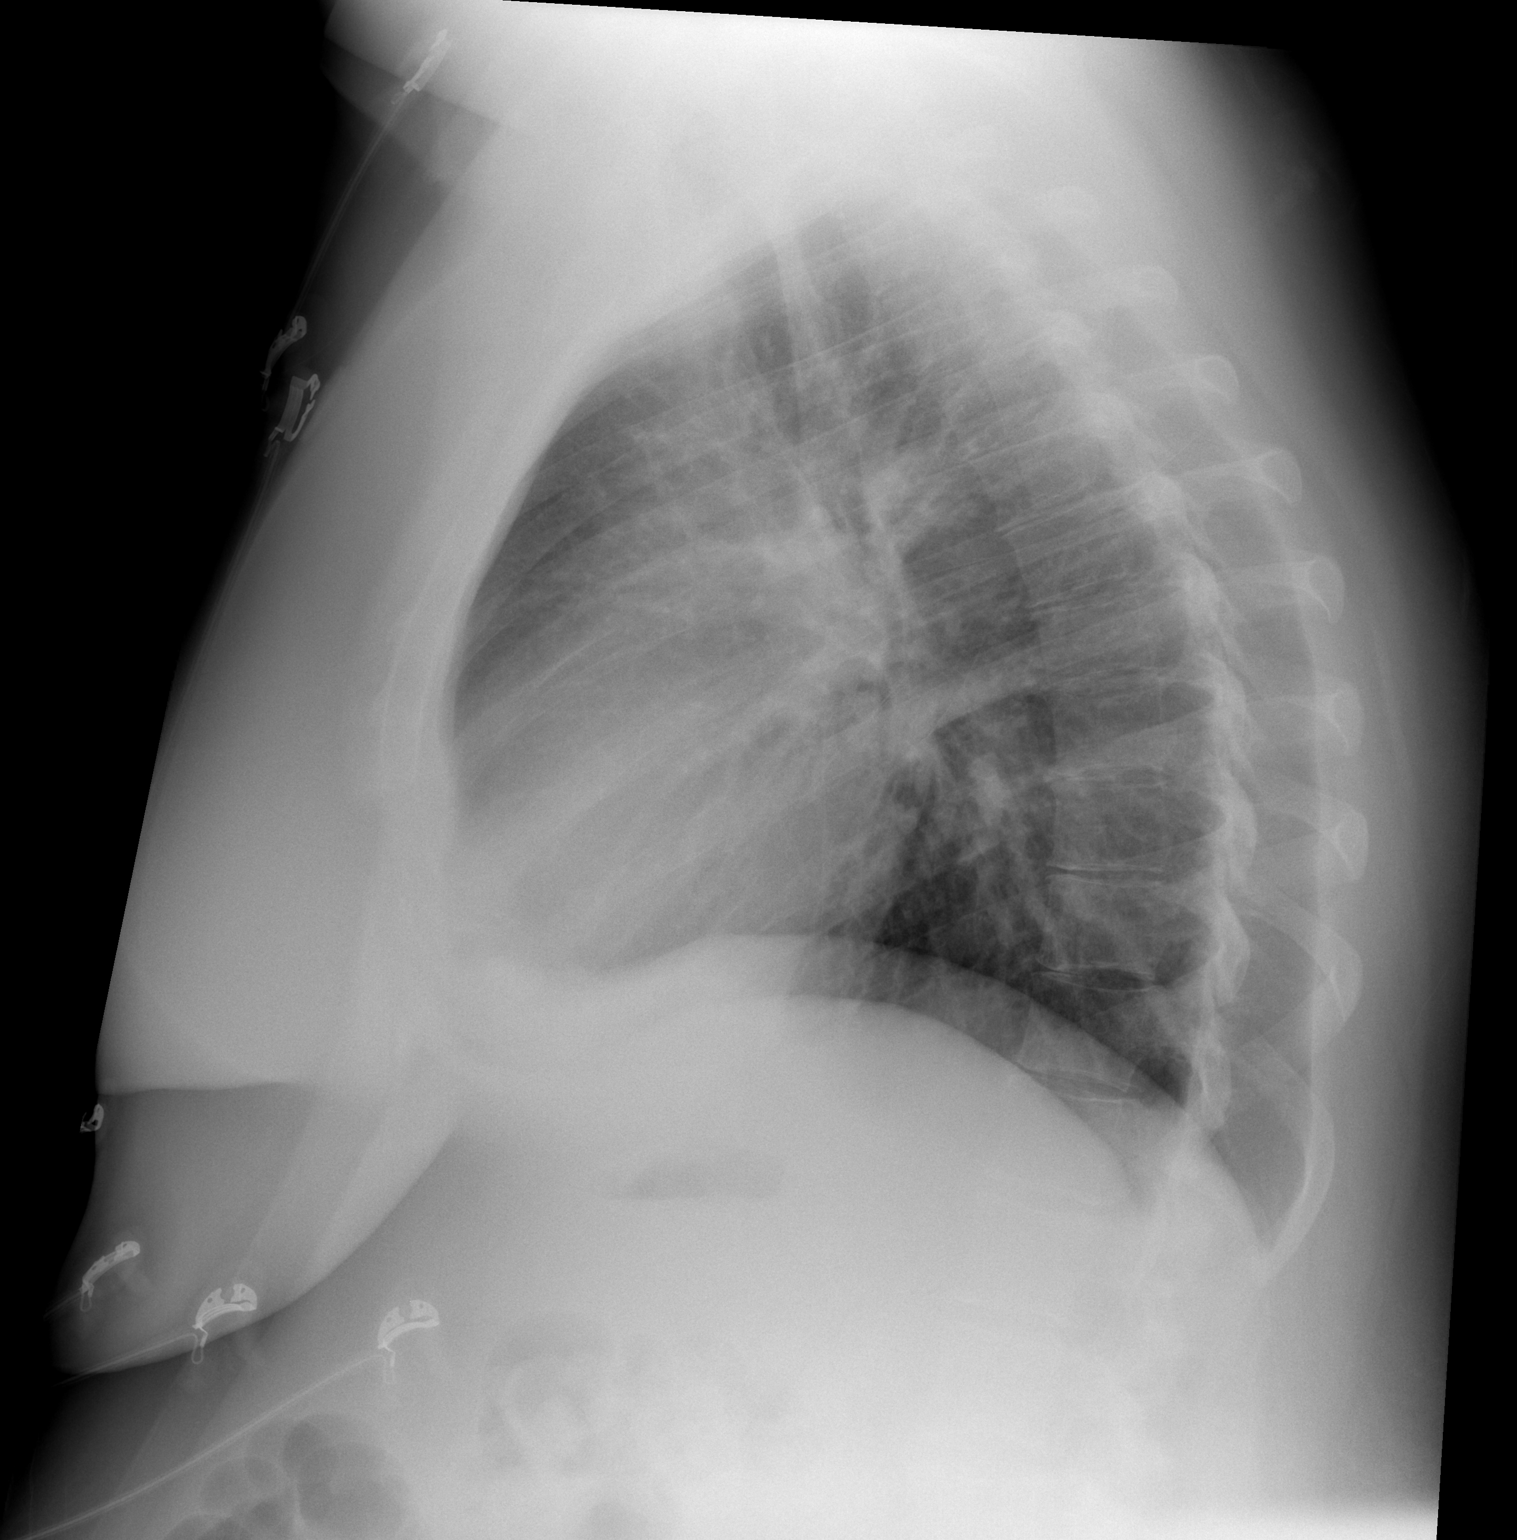

[2 of 2 positions shown; findings below may reference images not displayed]

FINDINGS: The heart size and mediastinal contours are within normal limits.
Both lungs are clear. The visualized skeletal structures are
unremarkable. Surgical clips in the base of the neck.
IMPRESSION: No active cardiopulmonary disease.

## 2015-12-18 MED ORDER — CYCLOBENZAPRINE HCL 10 MG PO TABS
10.0000 mg | ORAL_TABLET | Freq: Once | ORAL | Status: AC
  Administered 2015-12-18: 10 mg via ORAL
  Filled 2015-12-18: qty 1

## 2015-12-18 MED ORDER — ALBUTEROL SULFATE HFA 108 (90 BASE) MCG/ACT IN AERS
2.0000 | INHALATION_SPRAY | Freq: Once | RESPIRATORY_TRACT | Status: AC
  Administered 2015-12-18: 2 via RESPIRATORY_TRACT
  Filled 2015-12-18: qty 6.7

## 2015-12-18 MED ORDER — MORPHINE SULFATE (PF) 4 MG/ML IV SOLN
4.0000 mg | Freq: Once | INTRAVENOUS | Status: DC
  Filled 2015-12-18: qty 1

## 2015-12-18 NOTE — ED Notes (Signed)
Pt in c/o onset of arm pain described as muscle ache and then onset of sharp intermittent chest pain approx 4 hours ago at work. Also states recent cough.

## 2015-12-18 NOTE — ED Notes (Signed)
EDP made aware of Hgb: 5.7

## 2015-12-18 NOTE — ED Provider Notes (Signed)
CSN: [id number]     Arrival date & time 12/18/15  2153 History   First MD Initiated Contact with Patient 12/18/15 2205     Chief Complaint  Patient presents with  . Arm Pain  . Chest Pain     (Consider location/radiation/quality/duration/timing/severity/associated sxs/prior Treatment) HPI   Pt with hx morbid obesity, thyroid cancer treated 2012, smoking, presents with left arm aching that began around 5-6pm, the aching involves her entire arm, feels like a muscle ache, not worse with movement.  Does do heavy lifting as a server.  Approximately 8:30/9:00pm she started developed sharp left sided chest pains.  The pains are intermittent, come and go quickly but happen every few minutes.  Has taken ibuprofen without improvement. She is a smoker, has a chronic cough that has been more productive over the past two months.   She does take exogenous estrogen, estelle birth control pills.   Has met her father only once and at that time was told there was a family hx heart disease but she knows no other details.   Denies lightheadedness, dizziness, SOB, hemoptysis,  leg swelling, immobilization, weakness/lightheadedness, abdominal pain.  Pt does not significant increase in stress and anxiety recently.   Pt notes she is on birth control pills for excessive vaginal bleeding.  She has not had vaginal bleeding since February when she started the pills.  At the time she was told her Hgb was 6, no blood transfusions performed.  She was advised to take iron pills.    Past Medical History  Diagnosis Date  . Thyroid cancer [facility])    Past Surgical History  Procedure Laterality Date  . Thyroidectomy, partial     History reviewed. No pertinent family history. Social History  Substance Use Topics  . Smoking status: Current Every Day Smoker -- 0.50 packs/day    Types: Cigarettes  . Smokeless tobacco: Never Used  . Alcohol Use: No   OB History    No data available     Review of Systems  All other  systems reviewed and are negative.     Allergies  Review of patient's allergies indicates no known allergies.  Home Medications   Prior to Admission medications   Medication Sig Start Date End Date Taking? Authorizing Provider  ciprofloxacin (CIPRO) 500 MG tablet Take 1 tablet (500 mg total) by mouth 2 (two) times daily. 02/16/15   Fredia Sorrow, MD  HYDROcodone-acetaminophen (NORCO/VICODIN) 5-325 MG per tablet Take 1-2 tablets by mouth every 6 (six) hours as needed for moderate pain. 02/16/15   Fredia Sorrow, MD  levothyroxine (SYNTHROID, LEVOTHROID) 112 MCG tablet Take 112 mcg by mouth daily before breakfast.    Historical Provider, MD  loperamide (IMODIUM A-D) 2 MG tablet Take 1 tablet (2 mg total) by mouth 4 (four) times daily as needed for diarrhea or loose stools. 02/16/15   Fredia Sorrow, MD  methadone (DOLOPHINE) 10 MG/5ML solution Take 90 mg by mouth daily.    Historical Provider, MD   There were no vitals taken for this visit. Physical Exam  Constitutional: She appears well-developed and well-nourished. No distress.  Morbidly obese  HENT:  Head: Normocephalic and atraumatic.  Eyes: Conjunctivae are normal.  Neck: Normal range of motion. Neck supple.  Cardiovascular: Normal rate, regular rhythm and intact distal pulses.   Pulmonary/Chest: Effort normal. No respiratory distress. She has wheezes. She has no rales.  Diffuse wheezes  Abdominal: Soft. She exhibits no distension. There is no tenderness. There is no rebound and no  guarding.  Musculoskeletal: She exhibits no edema.  Neurological: She is alert. She exhibits normal muscle tone.  Skin: She is not diaphoretic.  Nursing note and vitals reviewed.   ED Course  Procedures (including critical care time) Labs Review Labs Reviewed  CBC WITH DIFFERENTIAL/PLATELET - Abnormal; Notable for the following:    RBC 3.08 (*)    Hemoglobin 5.7 (*)    HCT 21.4 (*)    MCV 69.5 (*)    MCH 18.5 (*)    MCHC 26.6 (*)    RDW 19.0  (*)    All other components within normal limits  D-DIMER, QUANTITATIVE (NOT AT [facility]) - Abnormal; Notable for the following:    D-Dimer, Quant 0.66 (*)    All other components within normal limits  BASIC METABOLIC PANEL  TROPONIN I  HCG, QUANTITATIVE, PREGNANCY    Imaging Review Dg Chest 2 View  12/18/2015  CLINICAL DATA:  Acute onset of arm pain described as a muscle a eighth and then onset of intermittent chest pains about 4 hours ago. Recent cough. EXAM: CHEST  2 VIEW COMPARISON:  None. FINDINGS: The heart size and mediastinal contours are within normal limits. Both lungs are clear. The visualized skeletal structures are unremarkable. Surgical clips in the base of the neck. IMPRESSION: No active cardiopulmonary disease. Electronically Signed   By: Lucienne Capers M.D.   On: 12/18/2015 23:51   I have personally reviewed and evaluated these images and lab results as part of my medical decision-making.   EKG Interpretation None      EKG documented by Dr Mollie Germany chart.    Filed Vitals:   12/18/15 2156 12/18/15 2230  BP: 159/67 124/58  Pulse: 106 85  Temp: 97.9 F (36.6 C)   Resp: 18 20    12:03 AM Pt reports feeling much better.  However, while discussing her lab results including the Hgb and d-dimer, plan for CT, she started having symptoms again, admits that she is actually very anxious and stressed out.     12:49 AM IV blew prior to chest CT and pt noted to be difficult stick.  I discussed the patient with Dr Florina Ou who also saw and examined the patient.  Given her only single reading of tachycardia and 100% oxygenation, low risk for PE, Dr Florina Ou advised canceling the CT.  Discussed this with patient and Dr Florina Ou discussed return precautions with her.  She is reassured and relieved to be going home, agrees with plan to cancel CT.    MDM   Final diagnoses:  Left arm pain  Atypical chest pain  Anemia, unspecified anemia type    Afebrile nontoxic patient with aching left  arm and intermittent left chest pain.  Chest pain is atypical, is sharp and fleeting, intermittent.  She has risk factors of obesity, smoking, exogenous estrogen, potential family hx heart disease - paternal family hx is actually unknown.  She has wheezing on exam and has had a productive cough, is a smoker.  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  Given albuterol inhaler for wheezing.  Labs significant for anemia, per pt Hgb was 6 last month and pt is no longer bleeding (prior vaginal bleed). Is not taking iron previously advised to take.  Discussed Hgb results with Dr Florina Ou and he has discussed this with the patient.  D-dimer elevated. CXR negative. EKG unremarkable.  CT angio chest cancelled, see discussion above. Patient's symptoms were reproduced with  any discussion about stress or anything that produced anxiety in her.  Pt d/c home with Rx for iron pills, follow up with Dr Marin Olp per Dr Florina Ou' recommendation, resources for PCP follow up.    HEART score is 2 points, low risk. [place] PE score is 1.5, low risk  Unable to [facility] initially due to tachycardia.     Clayton Bibles, PA-C 12/19/15 [place], MD 12/19/15 [phone]

## 2015-12-18 NOTE — ED Provider Notes (Signed)
ED ECG REPORT   Date: 12/18/2015  Rate: 94  Rhythm: normal sinus rhythm  QRS Axis: normal  Intervals: normal  ST/T Wave abnormalities: normal  Conduction Disutrbances:none  Narrative Interpretation:   Old EKG Reviewed: none available  I have personally reviewed the EKG tracing and agree with the computerized printout as noted.   Malvin Johns, MD 12/18/15 2240

## 2015-12-18 NOTE — ED Notes (Signed)
PA at bedside.

## 2015-12-19 LAB — HCG, QUANTITATIVE, PREGNANCY

## 2015-12-19 MED ORDER — IOHEXOL 350 MG/ML SOLN
100.0000 mL | Freq: Once | INTRAVENOUS | Status: AC | PRN
  Administered 2015-12-19: 100 mL via INTRAVENOUS

## 2015-12-19 MED ORDER — FERROUS SULFATE 325 (65 FE) MG PO TABS: 325.0000 mg | ORAL_TABLET | Freq: Every day | ORAL | Status: AC

## 2015-12-19 NOTE — Discharge Instructions (Signed)
Read the information below.  You may return to the Emergency Department at any time for worsening condition or any new symptoms that concern you.  If you develop worsening chest pain, shortness of breath, fever, you pass out, or become weak or dizzy, return to the ER for a recheck.   If you start bleeding uncontrollably following injury or for whatever reason, seek medical attention immediately.     Anemia, Nonspecific Anemia is a condition in which the concentration of red blood cells or hemoglobin in the blood is below normal. Hemoglobin is a substance in red blood cells that carries oxygen to the tissues of the body. Anemia results in not enough oxygen reaching these tissues.  CAUSES  Common causes of anemia include:   Excessive bleeding. Bleeding may be internal or external. This includes excessive bleeding from periods (in women) or from the intestine.   Poor nutrition.   Chronic kidney, thyroid, and liver disease.  Bone marrow disorders that decrease red blood cell production.  Cancer and treatments for cancer.  HIV, AIDS, and their treatments.  Spleen problems that increase red blood cell destruction.  Blood disorders.  Excess destruction of red blood cells due to infection, medicines, and autoimmune disorders. SIGNS AND SYMPTOMS   Minor weakness.   Dizziness.   Headache.  Palpitations.   Shortness of breath, especially with exercise.   Paleness.  Cold sensitivity.  Indigestion.  Nausea.  Difficulty sleeping.  Difficulty concentrating. Symptoms may occur suddenly or they may develop slowly.  DIAGNOSIS  Additional blood tests are often needed. These help your health care provider determine the best treatment. Your health care provider will check your stool for blood and look for other causes of blood loss.  TREATMENT  Treatment varies depending on the cause of the anemia. Treatment can include:   Supplements of iron, vitamin 123456, or folic acid.    Hormone medicines.   A blood transfusion. This may be needed if blood loss is severe.   Hospitalization. This may be needed if there is significant continual blood loss.   Dietary changes.  Spleen removal. HOME CARE INSTRUCTIONS Keep all follow-up appointments. It often takes many weeks to correct anemia, and having your health care provider check on your condition and your response to treatment is very important. SEEK IMMEDIATE MEDICAL CARE IF:   You develop extreme weakness, shortness of breath, or chest pain.   You become dizzy or have trouble concentrating.  You develop heavy vaginal bleeding.   You develop a rash.   You have bloody or black, tarry stools.   You faint.   You vomit up blood.   You vomit repeatedly.   You have abdominal pain.  You have a fever or persistent symptoms for more than 2-3 days.   You have a fever and your symptoms suddenly get worse.   You are dehydrated.  MAKE SURE YOU:  Understand these instructions.  Will watch your condition.  Will get help right away if you are not doing well or get worse.   This information is not intended to replace advice given to you by your health care provider. Make sure you discuss any questions you have with your health care provider.   Document Released: 11/08/2004 Document Revised: 06/03/2013 Document Reviewed: 03/27/2013 Elsevier Interactive Patient Education 2016 Elsevier Inc.  Nonspecific Chest Pain  Chest pain can be caused by many different conditions. There is always a chance that your pain could be related to something serious, such as a heart  attack or a blood clot in your lungs. Chest pain can also be caused by conditions that are not life-threatening. If you have chest pain, it is very important to follow up with your health care provider. CAUSES  Chest pain can be caused by:  Heartburn.  Pneumonia or bronchitis.  Anxiety or stress.  Inflammation around your heart  (pericarditis) or lung (pleuritis or pleurisy).  A blood clot in your lung.  A collapsed lung (pneumothorax). It can develop suddenly on its own (spontaneous pneumothorax) or from trauma to the chest.  Shingles infection (varicella-zoster virus).  Heart attack.  Damage to the bones, muscles, and cartilage that make up your chest wall. This can include:  Bruised bones due to injury.  Strained muscles or cartilage due to frequent or repeated coughing or overwork.  Fracture to one or more ribs.  Sore cartilage due to inflammation (costochondritis). RISK FACTORS  Risk factors for chest pain may include:  Activities that increase your risk for trauma or injury to your chest.  Respiratory infections or conditions that cause frequent coughing.  Medical conditions or overeating that can cause heartburn.  Heart disease or family history of heart disease.  Conditions or health behaviors that increase your risk of developing a blood clot.  Having had chicken pox (varicella zoster). SIGNS AND SYMPTOMS Chest pain can feel like:  Burning or tingling on the surface of your chest or deep in your chest.  Crushing, pressure, aching, or squeezing pain.  Dull or sharp pain that is worse when you move, cough, or take a deep breath.  Pain that is also felt in your back, neck, shoulder, or arm, or pain that spreads to any of these areas. Your chest pain may come and go, or it may stay constant. DIAGNOSIS Lab tests or other studies may be needed to find the cause of your pain. Your health care provider may have you take a test called an ambulatory ECG (electrocardiogram). An ECG records your heartbeat patterns at the time the test is performed. You may also have other tests, such as:  Transthoracic echocardiogram (TTE). During echocardiography, sound waves are used to create a picture of all of the heart structures and to look at how blood flows through your heart.  Transesophageal  echocardiogram (TEE).This is a more advanced imaging test that obtains images from inside your body. It allows your health care provider to see your heart in finer detail.  Cardiac monitoring. This allows your health care provider to monitor your heart rate and rhythm in real time.  Holter monitor. This is a portable device that records your heartbeat and can help to diagnose abnormal heartbeats. It allows your health care provider to track your heart activity for several days, if needed.  Stress tests. These can be done through exercise or by taking medicine that makes your heart beat more quickly.  Blood tests.  Imaging tests. TREATMENT  Your treatment depends on what is causing your chest pain. Treatment may include:  Medicines. These may include:  Acid blockers for heartburn.  Anti-inflammatory medicine.  Pain medicine for inflammatory conditions.  Antibiotic medicine, if an infection is present.  Medicines to dissolve blood clots.  Medicines to treat coronary artery disease.  Supportive care for conditions that do not require medicines. This may include:  Resting.  Applying heat or cold packs to injured areas.  Limiting activities until pain decreases. HOME CARE INSTRUCTIONS  If you were prescribed an antibiotic medicine, finish it all even if you start  to feel better.  Avoid any activities that bring on chest pain.  Do not use any tobacco products, including cigarettes, chewing tobacco, or electronic cigarettes. If you need help quitting, ask your health care provider.  Do not drink alcohol.  Take medicines only as directed by your health care provider.  Keep all follow-up visits as directed by your health care provider. This is important. This includes any further testing if your chest pain does not go away.  If heartburn is the cause for your chest pain, you may be told to keep your head raised (elevated) while sleeping. This reduces the chance that acid will  go from your stomach into your esophagus.  Make lifestyle changes as directed by your health care provider. These may include:  Getting regular exercise. Ask your health care provider to suggest some activities that are safe for you.  Eating a heart-healthy diet. A registered dietitian can help you to learn healthy eating options.  Maintaining a healthy weight.  Managing diabetes, if necessary.  Reducing stress. SEEK MEDICAL CARE IF:  Your chest pain does not go away after treatment.  You have a rash with blisters on your chest.  You have a fever. SEEK IMMEDIATE MEDICAL CARE IF:   Your chest pain is worse.  You have an increasing cough, or you cough up blood.  You have severe abdominal pain.  You have severe weakness.  You faint.  You have chills.  You have sudden, unexplained chest discomfort.  You have sudden, unexplained discomfort in your arms, back, neck, or jaw.  You have shortness of breath at any time.  You suddenly start to sweat, or your skin gets clammy.  You feel nauseous or you vomit.  You suddenly feel light-headed or dizzy.  Your heart begins to beat quickly, or it feels like it is skipping beats. These symptoms may represent a serious problem that is an emergency. Do not wait to see if the symptoms will go away. Get medical help right away. Call your local emergency services (911 in the U.S.). Do not drive yourself to the hospital.   This information is not intended to replace advice given to you by your health care provider. Make sure you discuss any questions you have with your health care provider.   Document Released: 07/11/2005 Document Revised: 10/22/2014 Document Reviewed: 05/07/2014 Elsevier Interactive Patient Education 2016 [place] [employer] 211 is a great source of information about community services available.  Access by dialing 2-1-1 from anywhere in [place], or  by website -  CustodianSupply.fi.   Other Local Resources (Updated 10/2015)  [place]    Phone Number and Address  [place] medical care - 1st and 3rd Saturday of every month  Must not qualify for public or private insurance and must have limited income [phone] [address], [place]  Child care  Emergency assistance for housing and [employer]  Medicaid [phone] [address], [place] [address]   [facility] Department  Low-cost medical care for children, communicable diseases, sexually-transmitted diseases, immunizations, maternity care, womens health and family planning [phone] [address] [place],  [address]  [facility]ication Management [facility]   Medication assistance for [facility] residents  Must meet income requirements [phone] [place], [place].    [facility]  Social Services  Child care  Emergency assistance for housing and [employer]  Medicaid [phone] [address] [place], [place] [address]  [facility]   Low-cost medical care,   Monday through Friday, 9 am to 6 pm.   Accepts Medicare/Medicaid, and self-pay [phone] [address] [place], [place] [address]  [facility] for [place] care - Monday through Friday, 8:30 am - 5:30 pm  Accepts Medicaid and self-pay [phone] [address] [place], [place] [address]   [facility]  Primary medical care, including for those with sickle cell disease  Accepts Medicare, Medicaid, insurance and self-pay [phone] [place], [place]  [facility]   Primary medical care  Accepts Medicare, [place], insurance and self-pay [phone]  [facility] Dar[address]. [address] [place], [place] [address]   [facility] Department of Social Services  Child care  Emergency assistance for housing and [employer]  Medicaid [phone] [address] [place], [place] [address]  [place] Department of Health and [employer]  Child care  Emergency assistance for housing and [employer]  Medicaid [phone] [place], [place] [address]   [facility] Medication Assistance Program  Medication assistance for [facility] residents with no insurance only  Must have a primary care doctor [phone], [place], [place]  [facility]   Primary medical care  [place], [place], insurance  [phone] [address] [place], [place]  [facility]   Medication assistance [phone]  [facility]   Primary medical care  Accepts Medicare, [place], insurance and self-pay [phone] [address], [place] [address]  [facility]   Primary medical care  Accepts Medicare, [place], insurance and self-pay [phone] [address] [place], [place] [address]  [facility]  For [place] residents between the ages of 7 and 49 who do not have any form of health insurance, Medicare, [place], or [place] benefits.  Services are provided free of charge to uninsured patients who fall within federal poverty guidelines.    Hours: Tuesdays and Thursdays, 4:15 - 8 pm [phone] [address], [place],  [address]  [facility]     Primary medical care  Dental care  Nutritional counseling  Pharmacy  Accepts Medicaid, Medicare, most insurance.  Fees are adjusted based on ability to pay.   [place], [place] [address], [place], [facility] [facility], [place], [facility] [place], [place]  [facility]  Womens health and family planning [phone] [address] [place], [place] care  Emergency assistance for housing and [employer]  Medicaid [phone] [address] [place],  [address]   [facility]    Ages 60 and older  Hours: Mondays and Thursdays, 7:00 am - 9:00 am Patients are seen on a first come, first served basis. [phone], ext. [place], [place]  Child care  Emergency assistance for housing and [employer] [phone]  [place], [place] [address]  [employer]  Medication assistance  Rental assistance  Food pantry  Medication assistance  Housing assistance  Emergency food distribution  Utility assistance [address] [place], [place]  [address]. [place], [place] [address] Hours: Tuesdays and Thursdays from 9am - 12 noon by appointment only  [place], [place] [address]  [facility] [place] private insurance, [place], and [place].  Payment is based on a sliding scale for those without insurance.  Hours: Mondays, Tuesdays and Thursdays, 8:30 am - 5:30 pm.   [phone] [place], [place]  [facility] - Family Medicine at [facility], [place], and [place].  Payment is based on a sliding scale for those without insurance. [phone] [address] [place], [place]  [facility] - Pediatrics at [address], research (medical), [employer], and [place].  Payment is based on a sliding scale for those without insurance [phone] [address], [employer], [place]  [facility] - Pediatrics at [employer], [place], and [place].  Payment is based on a sliding scale for those without insurance. [phone] [place], [place]  [facility] - Pediatrics at [facility], [place], and [place].  Payment is based on a sliding scale for those without insurance. [phone], ext. [phone] [place], [place].    [place] care.  Accepts Medicaid and self-pay. [place], [place]

## 2021-02-23 DIAGNOSIS — Z7689 Persons encountering health services in other specified circumstances: Secondary | ICD-10-CM | POA: Diagnosis not present

## 2021-02-23 DIAGNOSIS — E890 Postprocedural hypothyroidism: Secondary | ICD-10-CM | POA: Diagnosis not present

## 2021-02-23 DIAGNOSIS — Z6841 Body Mass Index (BMI) 40.0 and over, adult: Secondary | ICD-10-CM | POA: Diagnosis not present

## 2021-02-23 DIAGNOSIS — M25572 Pain in left ankle and joints of left foot: Secondary | ICD-10-CM | POA: Diagnosis not present

## 2021-02-23 DIAGNOSIS — R635 Abnormal weight gain: Secondary | ICD-10-CM | POA: Diagnosis not present

## 2021-05-23 DIAGNOSIS — J039 Acute tonsillitis, unspecified: Secondary | ICD-10-CM | POA: Diagnosis not present

## 2021-05-23 DIAGNOSIS — J029 Acute pharyngitis, unspecified: Secondary | ICD-10-CM | POA: Diagnosis not present

## 2021-06-02 DIAGNOSIS — J029 Acute pharyngitis, unspecified: Secondary | ICD-10-CM | POA: Diagnosis not present

## 2021-06-02 DIAGNOSIS — Z20822 Contact with and (suspected) exposure to covid-19: Secondary | ICD-10-CM | POA: Diagnosis not present

## 2021-10-17 ENCOUNTER — Ambulatory Visit (INDEPENDENT_AMBULATORY_CARE_PROVIDER_SITE_OTHER): Payer: PRIVATE HEALTH INSURANCE | Admitting: Family Medicine

## 2021-10-17 ENCOUNTER — Emergency Department (HOSPITAL_BASED_OUTPATIENT_CLINIC_OR_DEPARTMENT_OTHER): Payer: Self-pay

## 2021-10-17 ENCOUNTER — Ambulatory Visit: Payer: Self-pay

## 2021-10-17 ENCOUNTER — Encounter: Payer: Self-pay | Admitting: Family Medicine

## 2021-10-17 ENCOUNTER — Emergency Department (HOSPITAL_BASED_OUTPATIENT_CLINIC_OR_DEPARTMENT_OTHER)
Admission: EM | Admit: 2021-10-17 | Discharge: 2021-10-17 | Disposition: A | Discharge status: Home / Self Care | Payer: Self-pay | Attending: Emergency Medicine | Admitting: Emergency Medicine

## 2021-10-17 ENCOUNTER — Other Ambulatory Visit: Payer: Self-pay

## 2021-10-17 ENCOUNTER — Encounter (HOSPITAL_BASED_OUTPATIENT_CLINIC_OR_DEPARTMENT_OTHER): Payer: Self-pay | Admitting: Emergency Medicine

## 2021-10-17 VITALS — BP 137/60 | Ht 63.0 in | Wt 300.0 lb

## 2021-10-17 DIAGNOSIS — M76822 Posterior tibial tendinitis, left leg: Secondary | ICD-10-CM | POA: Insufficient documentation

## 2021-10-17 DIAGNOSIS — X58XXXD Exposure to other specified factors, subsequent encounter: Secondary | ICD-10-CM | POA: Insufficient documentation

## 2021-10-17 DIAGNOSIS — M25572 Pain in left ankle and joints of left foot: Secondary | ICD-10-CM

## 2021-10-17 DIAGNOSIS — S99922D Unspecified injury of left foot, subsequent encounter: Secondary | ICD-10-CM | POA: Insufficient documentation

## 2021-10-17 DIAGNOSIS — S86302A Unspecified injury of muscle(s) and tendon(s) of peroneal muscle group at lower leg level, left leg, initial encounter: Secondary | ICD-10-CM

## 2021-10-17 HISTORY — DX: Posterior tibial tendinitis, left leg: M76.822

## 2021-10-17 IMAGING — CR DG ANKLE COMPLETE 3+V*L*
3 series · 3 of 3 positions shown · non-contrast
Comparison: None.

CLINICAL DATA: Medial pain post blunt trauma

EXAM:
LEFT ANKLE COMPLETE - 3+ VIEW

[t ankle joint ap left *]
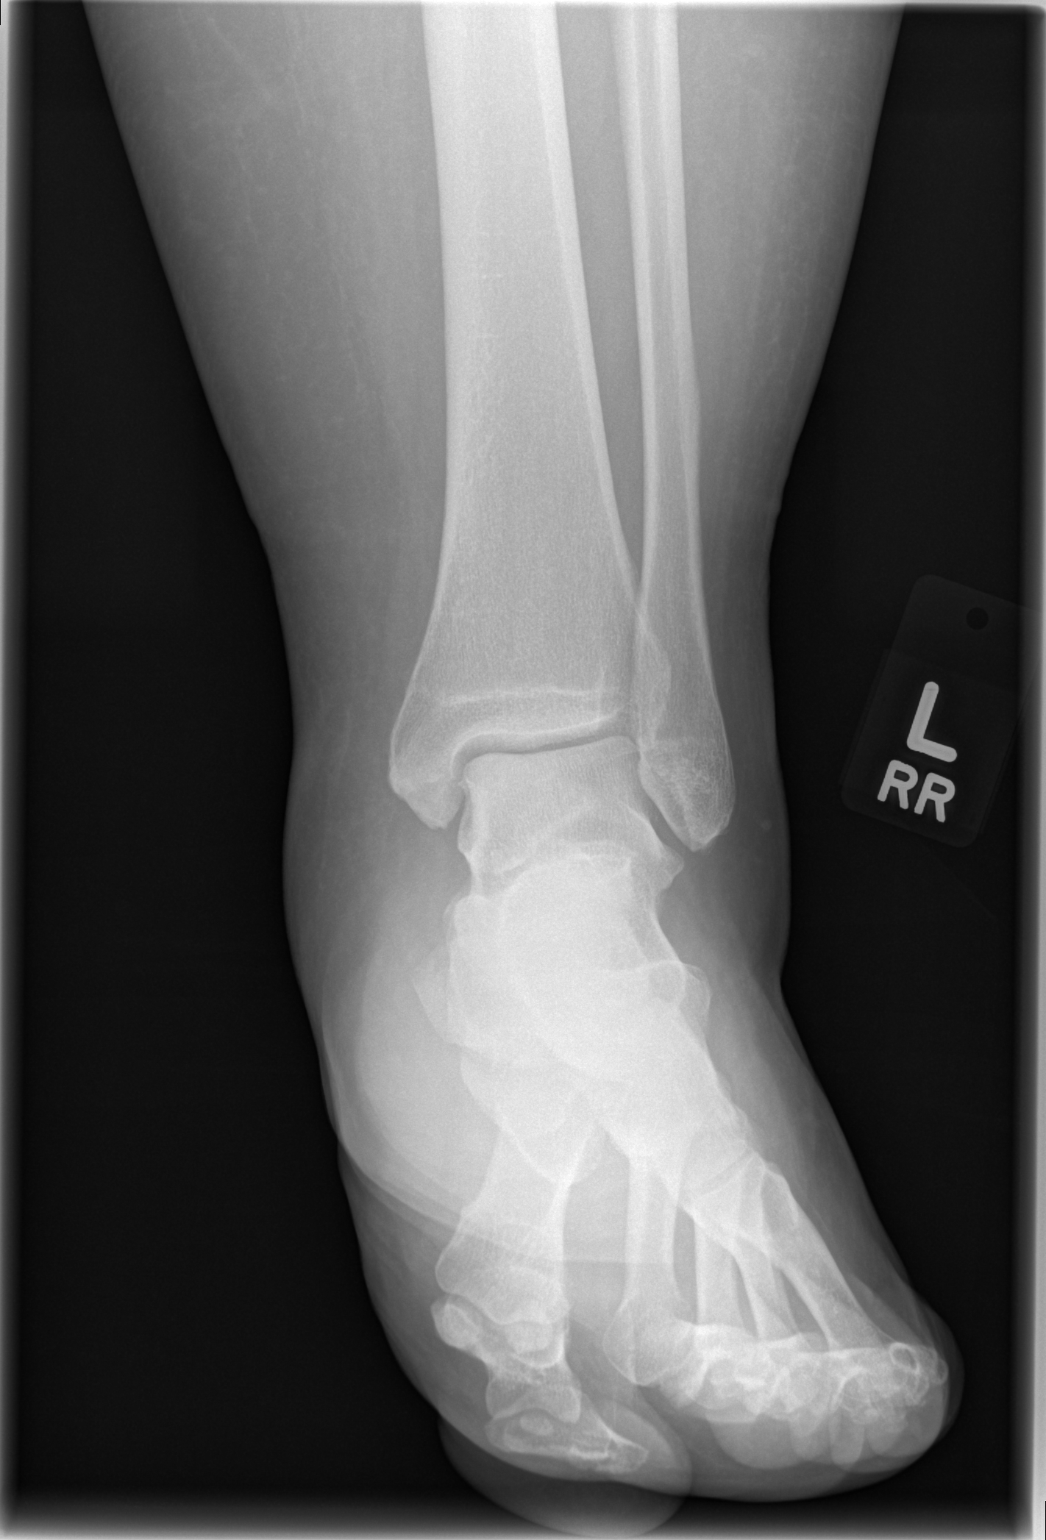

[t ankle joint oblique left *]
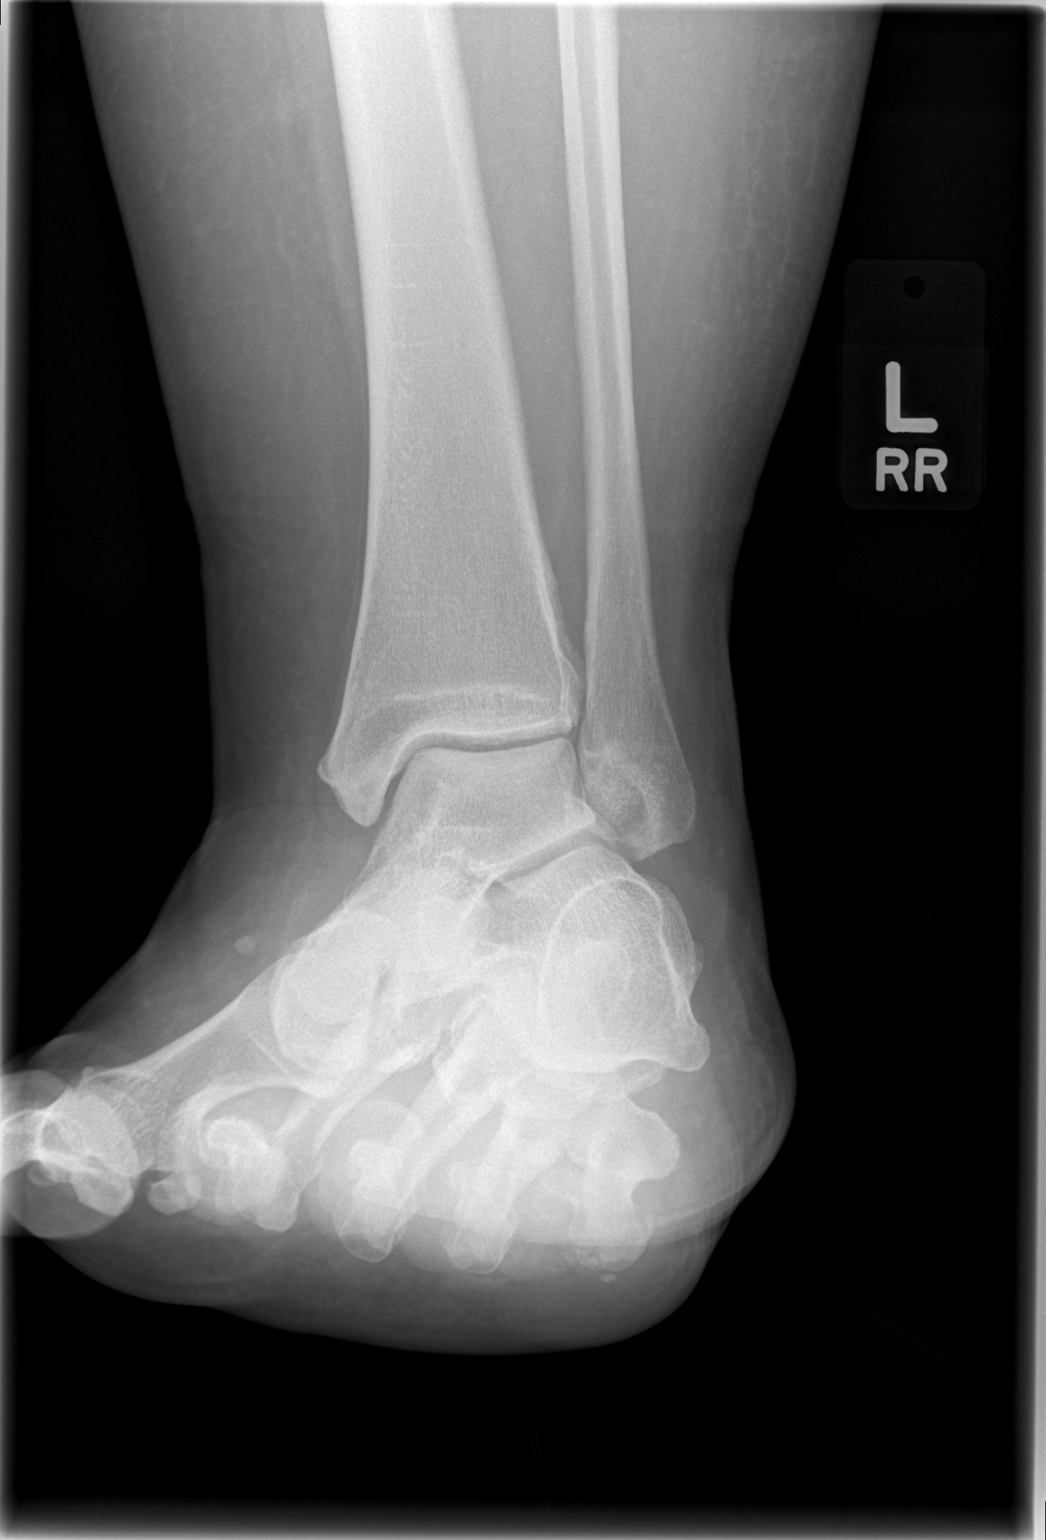

[t ankle joint lat left *]
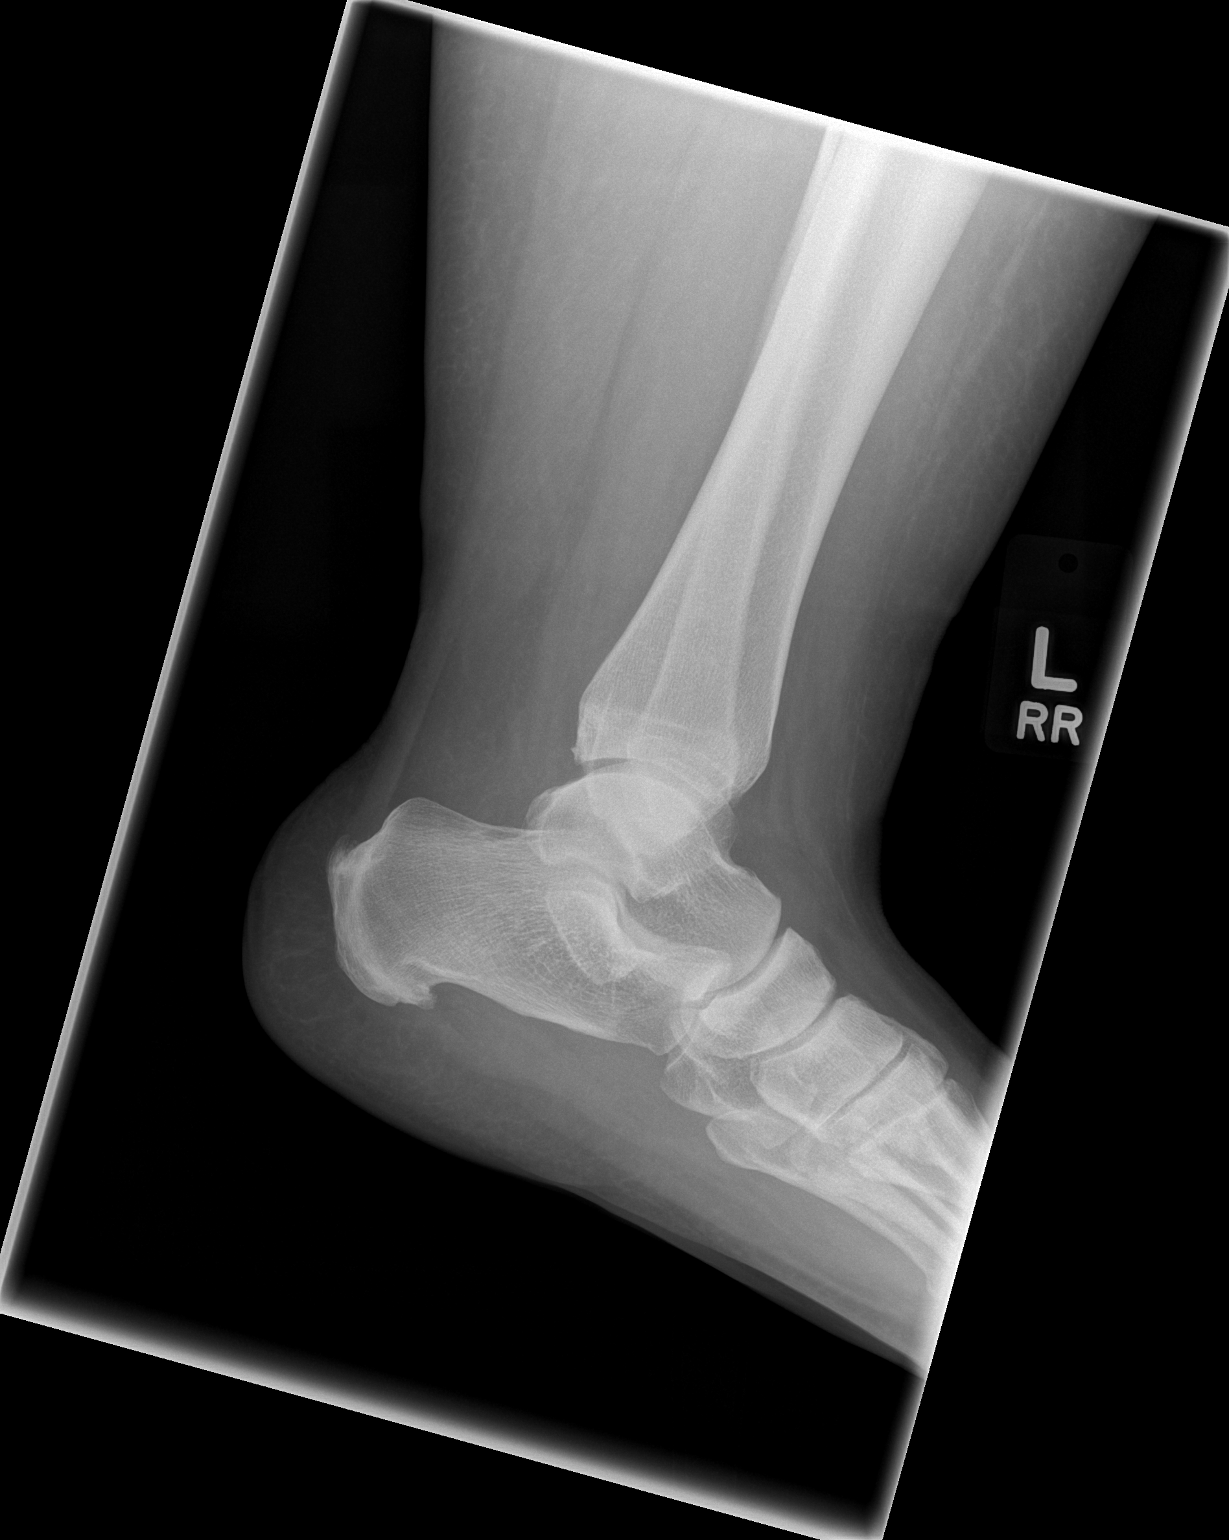

[3 of 3 positions shown; findings below may reference images not displayed]

FINDINGS: There is no evidence of fracture, dislocation, or joint effusion.
Calcaneal spurs. There is no evidence of arthropathy or other focal
bone abnormality. Soft tissues are unremarkable.
IMPRESSION: No acute findings

## 2021-10-17 MED ORDER — PREDNISONE 20 MG PO TABS: 40.0000 mg | ORAL_TABLET | Freq: Every day | ORAL | 0 refills | Status: DC

## 2021-10-17 NOTE — Assessment & Plan Note (Signed)
Acute on chronic in nature.  Has pes planus which has likely exacerbated the posterior tibialis changes. -Counseled on home exercise therapy and supportive care. -Green sport insoles with scaphoid pad. -Provided CAM Walker. -May need to consider injection or physical therapy. -Counseled on taking prednisone.

## 2021-10-17 NOTE — Progress Notes (Signed)
°  Catherine Nelson - 36 y.o. female MRN [id number]  Date of birth: October 22, 1985  SUBJECTIVE:  Including CC & ROS.  No chief complaint on file.   Catherine Nelson is a 36 y.o. female that is presenting with acute on chronic left foot pain.  The pain is occurring over the medial compartment.  Has been present for about a year.  Has gotten worse recently.  It is worse when she is up on her feet for extended periods of time.  Independent review of the left ankle x-ray from 1/3 shows no acute changes.   Review of Systems See HPI   HISTORY: Past Medical, Surgical, Social, and Family History Reviewed & Updated per EMR.   Pertinent Historical Findings include:  Past Medical History:  Diagnosis Date   Thyroid cancer ([place])     Past Surgical History:  Procedure Laterality Date   THYROIDECTOMY, PARTIAL      History reviewed. No pertinent family history.  Social History   Socioeconomic History   Marital status: Single    Spouse name: Not on file   Number of children: Not on file   Years of education: Not on file   Highest education level: Not on file  Occupational History   Not on file  Tobacco Use   Smoking status: Every Day    Packs/day: 0.50    Types: Cigarettes   Smokeless tobacco: Never  Substance and Sexual Activity   Alcohol use: Not Currently   Drug use: Not Currently   Sexual activity: Yes    Birth control/protection: None  Other Topics Concern   Not on file  Social History Narrative   Not on file   Social Determinants of Health   Financial Resource Strain: Not on file  Food Insecurity: Not on file  Transportation Needs: Not on file  Physical Activity: Not on file  Stress: Not on file  Social Connections: Not on file  Intimate Partner Violence: Not on file     PHYSICAL EXAM:  VS: BP 137/60 (BP Location: Left Arm, Patient Position: Sitting)    Ht 5\' 3"  (1.6 m)    Wt 300 lb (136.1 kg)    LMP 10/09/2021    BMI 53.14 kg/m  Physical Exam Gen: NAD, alert, cooperative  with exam, well-appearing   Limited ultrasound: Left foot:  Effusion are noted around the posterior tibialis with increased hyperemia.  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  Has pes planus which has likely exacerbated the posterior tibialis changes. -Counseled on home exercise therapy and supportive care. -Green sport insoles with scaphoid pad. -Provided CAM Walker. -May need to consider injection or physical therapy. -Counseled on taking prednisone.

## 2021-10-17 NOTE — Patient Instructions (Signed)
Nice to meet you Please try ice  Please try the boot and insole  Please take the medicine   Please send me a message in MyChart with any questions or updates.  Please see me back in 3-4 weeks.   --Dr. Raeford Razor

## 2021-10-17 NOTE — Discharge Instructions (Addendum)
Please use Tylenol or ibuprofen for pain.  You may use 600 mg ibuprofen every 6 hours or 1000 mg of Tylenol every 6 hours.  You may choose to alternate between the 2.  This would be most effective.  Not to exceed 4 g of Tylenol within 24 hours.  Not to exceed 3200 mg ibuprofen 24 hours.  Your Xray report: EXAM:  LEFT ANKLE COMPLETE - 3+ VIEW     COMPARISON:  None.     FINDINGS:  There is no evidence of fracture, dislocation, or joint effusion.  Calcaneal spurs. There is no evidence of arthropathy or other focal  bone abnormality. Soft tissues are unremarkable.     IMPRESSION:  No acute findings

## 2021-10-17 NOTE — ED Notes (Signed)
D/c post op shoe

## 2021-10-17 NOTE — ED Triage Notes (Addendum)
Pt c/o LT ankle pain x 1 yr after a car door closed on it; no new injury, but pain is increasing; pt sts she cannot take narcotics (methadone clinic pt)

## 2021-10-17 NOTE — ED Provider Notes (Signed)
Catherine Nelson Provider Note   CSN: [id number] Arrival date & time: 10/17/21  1018     History  Chief Complaint  Patient presents with   Ankle Pain    Catherine Nelson is a 36 y.o. female with past medical history consistent for known injury to the left ankle around 1 year ago, who presents with continued swelling, pain, and tenderness of the left ankle.  Patient reports that she has not had any new injury to the affected location.  Patient reports that she has not been able to stay off of the ankle because she works 2 jobs and has to be on her feet.  Patient reports that she uses ibuprofen, Tylenol, as well as IcyHot patches versus cream for pain control with minimal relief.  Patient reports that the pain radiates up the lateral side of her left ankle especially at the end of the day.  Patient has not been to see an orthopedic doctor, or had prior imaging of the ankle.  Patient denies any numbness or tingling of the affected extremity.  Patient denies any shortness of breath, cough,.   Ankle Pain     Home Medications Prior to Admission medications   Medication Sig Start Date End Date Taking? Authorizing Provider  predniSONE (DELTASONE) 20 MG tablet Take 2 tablets (40 mg total) by mouth daily. 10/17/21  Yes Tajai Suder H, PA-C  ferrous sulfate 325 (65 FE) MG tablet Take 1 tablet (325 mg total) by mouth daily. 12/19/15   Clayton Bibles, PA-C  levothyroxine (SYNTHROID, LEVOTHROID) 112 MCG tablet Take 112 mcg by mouth daily before breakfast.    [provider]  loperamide (IMODIUM A-D) 2 MG tablet Take 1 tablet (2 mg total) by mouth 4 (four) times daily as needed for diarrhea or loose stools. 02/16/15   Fredia Sorrow, MD  methadone (DOLOPHINE) 10 MG/5ML solution Take 90 mg by mouth daily.    [provider]      Allergies    Patient has no known allergies.    Review of Systems   Review of Systems  Musculoskeletal:  Positive for joint  swelling.  All other systems reviewed and are negative.  Physical Exam Updated Vital Signs BP 137/60    Pulse 81    Temp 98.2 F (36.8 C) (Oral)    Resp 20    Ht 5\' 3"  (1.6 m)    Wt 136.1 kg    LMP 10/09/2021    SpO2 97%    BMI 53.14 kg/m  Physical Exam Vitals and nursing note reviewed.  Constitutional:      General: She is not in acute distress.    Appearance: Normal appearance.  HENT:     Head: Normocephalic and atraumatic.  Eyes:     General:        Right eye: No discharge.        Left eye: No discharge.  Cardiovascular:     Rate and Rhythm: Normal rate and regular rhythm.     Pulses: Normal pulses.     Comments: Intact DP, PT pulses bilaterally Pulmonary:     Effort: Pulmonary effort is normal. No respiratory distress.  Musculoskeletal:        General: No deformity.     Comments: Tenderness to palpation, and some swelling noted on the lateral aspect of the left ankle.  There is tenderness over the lateral malleolus.  There is tenderness along the distribution of the peroneal tendon.  Patient has intact dorsiflexion, plantar  flexion, inversion, eversion of the ankle with some pain.  Skin:    General: Skin is warm and dry.     Capillary Refill: Capillary refill takes less than 2 seconds.  Neurological:     Mental Status: She is alert and oriented to person, place, and time.  Psychiatric:        Mood and Affect: Mood normal.        Behavior: Behavior normal.    ED Results / Procedures / Treatments   Labs (all labs ordered are listed, but only abnormal results are displayed) Labs Reviewed - No data to display  EKG None  Radiology DG Ankle Complete Left  Result Date: 10/17/2021 CLINICAL DATA:  Medial pain post blunt trauma EXAM: LEFT ANKLE COMPLETE - 3+ VIEW COMPARISON:  None. FINDINGS: There is no evidence of fracture, dislocation, or joint effusion. Calcaneal spurs. There is no evidence of arthropathy or other focal bone abnormality. Soft tissues are unremarkable.  IMPRESSION: No acute findings Electronically Signed   By: Lucrezia Europe M.D.   On: 10/17/2021 11:25    Procedures Procedures    Medications Ordered in ED Medications - No data to display  ED Course/ Medical Decision Making/ A&P                           Medical Decision Making  This patient presents to the ED for concern of left ankle injury, this involves an extensive number of treatment options, and is a complaint that carries with it a high risk of complications and morbidity.  The differential diagnosis includes peroneal injury, rupture, ankle sprain, fracture, less concern for DVT.   Co morbidities that complicate the patient evaluation  obesity  Imaging Studies ordered:  I ordered imaging studies including xray of left ankle. I independently visualized and interpreted imaging which showed no fracture, dislocation, does indicate calcaneal spurs. I agree with the radiologist interpretation   Problem List / ED Course:  Peroneal tendon strain, ankle swelling  Social Determinants of Health:  Patient reports she does not have insurance so it is difficult for her to see a specialist   Disposition:  After consideration of the diagnostic results and the patients response to treatment, I feel that the patient would benefit from pulsed dose steroids, continued use of NSAIDs, Tylenol for pain control, RICE, and follow-up with orthopedics for further evaluation.  Patient reports that she does have plans to see orthopedics.  Her physical exam is consistent with a peroneal tendon strain, she has neurovascularly intact lower extremity.  Discussed that a postop shoe may help with some of the pain, patient declines at this time states that it does not feel like it is stabilizing her ankle.  Patient discharged in stable condition, return precautions given.  Final Clinical Impression(s) / ED Diagnoses Final diagnoses:  Injury of peroneal tendon of left foot, initial encounter    Rx / DC  Orders ED Discharge Orders          Ordered    predniSONE (DELTASONE) 20 MG tablet  Daily        10/17/21 1410              Tamlyn Sides, Seymour H, PA-C 10/17/21 1456    Gareth Morgan, MD 10/17/21 2229

## 2021-11-14 ENCOUNTER — Ambulatory Visit: Payer: Self-pay | Admitting: Family Medicine

## 2023-01-31 ENCOUNTER — Encounter: Payer: Self-pay | Admitting: *Deleted

## 2023-03-26 ENCOUNTER — Other Ambulatory Visit: Payer: Self-pay

## 2023-03-26 ENCOUNTER — Encounter (HOSPITAL_BASED_OUTPATIENT_CLINIC_OR_DEPARTMENT_OTHER): Payer: Self-pay | Admitting: Emergency Medicine

## 2023-03-26 DIAGNOSIS — F17200 Nicotine dependence, unspecified, uncomplicated: Secondary | ICD-10-CM | POA: Insufficient documentation

## 2023-03-26 DIAGNOSIS — S61307A Unspecified open wound of left little finger with damage to nail, initial encounter: Secondary | ICD-10-CM | POA: Insufficient documentation

## 2023-03-26 DIAGNOSIS — Z85850 Personal history of malignant neoplasm of thyroid: Secondary | ICD-10-CM | POA: Insufficient documentation
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
## 2023-03-27 ENCOUNTER — Emergency Department (HOSPITAL_BASED_OUTPATIENT_CLINIC_OR_DEPARTMENT_OTHER)
Admission: EM | Admit: 2023-03-27 | Discharge: 2023-03-27 | Disposition: A | Discharge status: Home / Self Care | Payer: Self-pay | Attending: Emergency Medicine | Admitting: Emergency Medicine

## 2023-03-27 DIAGNOSIS — S61309A Unspecified open wound of unspecified finger with damage to nail, initial encounter: Secondary | ICD-10-CM

## 2023-03-27 MED ORDER — LIDOCAINE HCL 2 % IJ SOLN
10.0000 mL | Freq: Once | INTRAMUSCULAR | Status: AC
  Administered 2023-03-27: 200 mg
  Filled 2023-03-27: qty 20

## 2023-03-27 MED ORDER — CEPHALEXIN 250 MG PO CAPS
1000.0000 mg | ORAL_CAPSULE | Freq: Once | ORAL | Status: AC
  Administered 2023-03-27: 1000 mg via ORAL
  Filled 2023-03-27: qty 4

## 2023-03-27 MED ORDER — FLUCONAZOLE 150 MG PO TABS: ORAL_TABLET | ORAL | 0 refills | Status: DC

## 2023-03-27 MED ORDER — CEPHALEXIN 500 MG PO CAPS: 500.0000 mg | ORAL_CAPSULE | Freq: Four times a day (QID) | ORAL | 0 refills | Status: DC
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
## 2023-04-06 ENCOUNTER — Encounter (HOSPITAL_BASED_OUTPATIENT_CLINIC_OR_DEPARTMENT_OTHER): Payer: Self-pay | Admitting: Emergency Medicine

## 2023-04-06 DIAGNOSIS — Z85850 Personal history of malignant neoplasm of thyroid: Secondary | ICD-10-CM | POA: Insufficient documentation

## 2023-04-06 DIAGNOSIS — L03012 Cellulitis of left finger: Secondary | ICD-10-CM | POA: Insufficient documentation

## 2023-04-06 DIAGNOSIS — F17210 Nicotine dependence, cigarettes, uncomplicated: Secondary | ICD-10-CM | POA: Insufficient documentation

## 2023-04-06 NOTE — ED Triage Notes (Signed)
Requesting wound check of left pinky finger. Seen here on 6/12, had finger nail reset and one suture placed and prescribed abx. Pt did not start taking abx initially until she accidentally struck same finger 2 days ago and noticed increased pain, swelling and drainage. Picked up abx today and started taking them.

## 2023-04-07 ENCOUNTER — Emergency Department (HOSPITAL_BASED_OUTPATIENT_CLINIC_OR_DEPARTMENT_OTHER)
Admission: EM | Admit: 2023-04-07 | Discharge: 2023-04-07 | Disposition: A | Discharge status: Home / Self Care | Payer: Self-pay | Attending: Emergency Medicine | Admitting: Emergency Medicine

## 2023-04-07 DIAGNOSIS — L03012 Cellulitis of left finger: Secondary | ICD-10-CM

## 2023-04-07 MED ORDER — DOXYCYCLINE HYCLATE 100 MG PO CAPS: 100.0000 mg | ORAL_CAPSULE | Freq: Two times a day (BID) | ORAL | 0 refills | Status: DC

## 2023-04-07 MED ORDER — LIDOCAINE HCL 2 % IJ SOLN: 10.0000 mL | Freq: Once | INTRAMUSCULAR | Status: AC

## 2023-04-07 MED ORDER — LIDOCAINE HCL 2 % IJ SOLN
INTRAMUSCULAR | Status: AC
  Administered 2023-04-07: 10 mL via INTRADERMAL
  Filled 2023-04-07: qty 20

## 2023-04-07 MED ORDER — DOXYCYCLINE HYCLATE 100 MG PO TABS
100.0000 mg | ORAL_TABLET | Freq: Once | ORAL | Status: AC
  Administered 2023-04-07: 100 mg via ORAL
  Filled 2023-04-07: qty 1

## 2023-04-07 NOTE — ED Notes (Signed)
..  The patient is A&OX4, ambulatory at d/c with independent steady gait, NAD. Pt verbalized understanding of d/c instructions, prescription and follow up care.

## 2023-04-07 NOTE — ED Provider Notes (Signed)
[facility] Provider Note: Catherine Dell, MD, FACEP  CSN: [id number] MRN: [id number] ARRIVAL: 04/06/23 at 2255 ROOM: MH04/MH04   CHIEF COMPLAINT  Wound Check   HISTORY OF PRESENT ILLNESS  04/07/23 12:30 AM Catherine Nelson is a 37 y.o. female who was seen by myself on 03/27/2023 for partial avulsion of the left fifth fingernail.  The nail was receded into the nail fold and anchored with a single Prolene suture.  The nailbed was copiously irrigated with wound cleaner and she was prescribed Keflex which she did not get filled.  She struck the same finger 2 days ago and has subsequently noticed increased pain, swelling and purulent drainage.  She got her prescription filled yesterday and started taking it.  She rates associated pain as a 5 out of 10.   Past Medical History:  Diagnosis Date   Thyroid cancer [facility])     Past Surgical History:  Procedure Laterality Date   THYROIDECTOMY, PARTIAL      History reviewed. No pertinent family history.  Social History   Tobacco Use   Smoking status: Every Day    Packs/day: .5    Types: Cigarettes   Smokeless tobacco: Never  Vaping Use   Vaping Use: Every day  Substance Use Topics   Alcohol use: Not Currently   Drug use: Not Currently    Prior to Admission medications   Medication Sig Start Date End Date Taking? Authorizing Provider  cephALEXin (KEFLEX) 500 MG capsule Take 1 capsule (500 mg total) by mouth 4 (four) times daily. 03/27/23   Karrisa Didio, MD  ferrous sulfate 325 (65 FE) MG tablet Take 1 tablet (325 mg total) by mouth daily. 12/19/15   Trixie Dredge, PA-C  fluconazole (DIFLUCAN) 150 MG tablet Take 1 tablet as needed for vaginal yeast infection.  May repeat in 3 days if symptoms persist. 03/27/23   Dasja Brase, Jonny Ruiz, MD  levothyroxine (SYNTHROID, LEVOTHROID) 112 MCG tablet Take 112 mcg by mouth daily before breakfast.    [provider]  loperamide (IMODIUM A-D) 2 MG tablet Take 1 tablet (2 mg total) by mouth 4  (four) times daily as needed for diarrhea or loose stools. 02/16/15   Vanetta Mulders, MD  methadone (DOLOPHINE) 10 MG/5ML solution Take 90 mg by mouth daily.    [provider]  predniSONE (DELTASONE) 20 MG tablet Take 2 tablets (40 mg total) by mouth daily. 10/17/21   Prosperi, Christian H, PA-C    Allergies Patient has no known allergies.   REVIEW OF SYSTEMS  Negative except as noted here or in the History of Present Illness.   PHYSICAL EXAMINATION  Initial Vital Signs Blood pressure (!) 153/97, pulse 96, temperature 98 F (36.7 C), temperature source Oral, resp. rate 16, height 5\' 3"  (1.6 m), weight (!) 161.9 kg, last menstrual period 03/31/2023, SpO2 98 %.  Examination General: Well-developed, well-nourished female in no acute distress; appearance consistent with age of record HENT: normocephalic; atraumatic Eyes: Normal appearance Neck: supple Heart: regular rate and rhythm Lungs: clear to auscultation bilaterally Abdomen: soft; nondistended; nontender; bowel sounds present Extremities: No deformity; left fifth fingernail still well-seated in nail fold but with tenderness and purulent drainage from around the suture, no felon Neurologic: Awake, alert and oriented; motor function intact in all extremities and symmetric; no facial droop Skin: Warm and dry Psychiatric: Normal mood and affect   RESULTS  Summary of this visit's results, reviewed and interpreted by myself:   EKG Interpretation  Date/Time:    Ventricular  Rate:    PR Interval:    QRS Duration:   QT Interval:    QTC Calculation:   R Axis:     Text Interpretation:         Laboratory Studies: No results found for this or any previous visit (from the past 24 hour(s)). Imaging Studies: No results found.  ED COURSE and MDM  Nursing notes, initial and subsequent vitals signs, including pulse oximetry, reviewed and interpreted by myself.  Vitals:   04/06/23 2307 04/06/23 2307  BP:  (!) 153/97   Pulse:  96  Resp:  16  Temp:  98 F (36.7 C)  TempSrc:  Oral  SpO2:  98%  Weight: (!) 161.9 kg   Height: 5\' 3"  (1.6 m)    Medications  lidocaine (XYLOCAINE) 2 % (with pres) injection 200 mg (10 mLs Intradermal Given by Other 04/07/23 0045)   The patient's finger appears to have developed a paronychia at the site of the anchor suture.  The suture was removed and the paronychia drained as described below.  We will place the patient on doxycycline as I doubt Keflex will cover it as I suspect MRSA. The patient was advised to keep the fingertip bandage to hold the nail in place as it still needs to splint the nail fold as a new nail grows in.  PROCEDURES  Procedures INCISION AND DRAINAGE Performed by: Carlisle Beers Larene Ascencio Consent: Verbal consent obtained. Risks and benefits: risks, benefits and alternatives were discussed Type: abscess  Body area: Left fifth finger  Anesthesia: Hemi digital block  Incision was made with a scalpel; suture was removed as this appears to be the nidus of the infection  Local anesthetic: lidocaine 2% without epinephrine  Anesthetic total: 2 ml  Complexity: Simple Blunt dissection to break up loculations  Drainage: purulent  Drainage amount: Moderate  Packing material: None  Patient tolerance: Patient tolerated the procedure well with no immediate complications.  ED DIAGNOSES     ICD-10-CM   1. Paronychia of finger of left hand  L03.012          Wessie Shanks, Jonny Ruiz, MD 04/07/23 [phone]

## 2023-12-03 ENCOUNTER — Encounter (HOSPITAL_BASED_OUTPATIENT_CLINIC_OR_DEPARTMENT_OTHER): Payer: Self-pay | Admitting: *Deleted

## 2023-12-03 ENCOUNTER — Emergency Department (HOSPITAL_BASED_OUTPATIENT_CLINIC_OR_DEPARTMENT_OTHER)
Admission: EM | Admit: 2023-12-03 | Discharge: 2023-12-04 | Disposition: A | Discharge status: Home / Self Care | Payer: Self-pay | Attending: Emergency Medicine | Admitting: Emergency Medicine

## 2023-12-03 ENCOUNTER — Other Ambulatory Visit: Payer: Self-pay

## 2023-12-03 DIAGNOSIS — N921 Excessive and frequent menstruation with irregular cycle: Secondary | ICD-10-CM | POA: Insufficient documentation

## 2023-12-03 DIAGNOSIS — Z79899 Other long term (current) drug therapy: Secondary | ICD-10-CM | POA: Insufficient documentation

## 2023-12-03 LAB — CBC
HCT: 31.8 % — ABNORMAL LOW (ref 36.0–46.0)
Hemoglobin: 10 g/dL — ABNORMAL LOW (ref 12.0–15.0)
MCH: 26.4 pg (ref 26.0–34.0)
MCHC: 31.4 g/dL (ref 30.0–36.0)
MCV: 83.9 fL (ref 80.0–100.0)
Platelets: 231 10*3/uL (ref 150–400)
RBC: 3.79 MIL/uL — ABNORMAL LOW (ref 3.87–5.11)
RDW: 15.3 % (ref 11.5–15.5)
WBC: 7.3 10*3/uL (ref 4.0–10.5)
nRBC: 0.7 % — ABNORMAL HIGH (ref 0.0–0.2)

## 2023-12-03 LAB — PREGNANCY, URINE: Preg Test, Ur: NEGATIVE

## 2023-12-03 NOTE — ED Triage Notes (Signed)
Pt with hx of heavy vaginal bleeding is here due to extra heavy period with clots the size of about half her palm.  Pt has been soaking through a pad in about an hour to two.

## 2023-12-04 MED ORDER — MEDROXYPROGESTERONE ACETATE 5 MG PO TABS: 5.0000 mg | ORAL_TABLET | Freq: Every day | ORAL | 0 refills | Status: DC

## 2023-12-04 NOTE — ED Provider Notes (Signed)
[place] [facility] Provider Note   CSN: [id number] Arrival date & time: 12/03/23  2042     History  Chief Complaint  Patient presents with   Vaginal Bleeding    Catherine Nelson is a 38 y.o. female.  The history is provided by the patient.  Vaginal Bleeding Quality:  Clots Onset quality:  Gradual Duration:  1 week Timing:  Constant Progression:  Waxing and waning Chronicity:  Recurrent Menstrual history:  Irregular Context: spontaneously   Relieved by:  Nothing Worsened by:  Nothing Ineffective treatments:  None tried Associated symptoms: no abdominal pain, no dysuria and no fever   Risk factors: no bleeding disorder        Home Medications Prior to Admission medications   Medication Sig Start Date End Date Taking? Authorizing Provider  medroxyPROGESTERone (PROVERA) 5 MG tablet Take 1 tablet (5 mg total) by mouth daily. 12/04/23  Yes Eleonore Shippee, MD  cephALEXin (KEFLEX) 500 MG capsule Take 1 capsule (500 mg total) by mouth 4 (four) times daily. 03/27/23   Molpus, John, MD  doxycycline (VIBRAMYCIN) 100 MG capsule Take 1 capsule (100 mg total) by mouth 2 (two) times daily. One po bid x 7 days 04/07/23   Molpus, John, MD  ferrous sulfate 325 (65 FE) MG tablet Take 1 tablet (325 mg total) by mouth daily. 12/19/15   Trixie Dredge, PA-C  fluconazole (DIFLUCAN) 150 MG tablet Take 1 tablet as needed for vaginal yeast infection.  May repeat in 3 days if symptoms persist. 03/27/23   Molpus, Jonny Ruiz, MD  levothyroxine (SYNTHROID, LEVOTHROID) 112 MCG tablet Take 112 mcg by mouth daily before breakfast.    [provider]  loperamide (IMODIUM A-D) 2 MG tablet Take 1 tablet (2 mg total) by mouth 4 (four) times daily as needed for diarrhea or loose stools. 02/16/15   Vanetta Mulders, MD  methadone (DOLOPHINE) 10 MG/5ML solution Take 90 mg by mouth daily.    [provider]  predniSONE (DELTASONE) 20 MG tablet Take 2 tablets (40 mg total) by  mouth daily. 10/17/21   Prosperi, Christian H, PA-C      Allergies    Patient has no known allergies.    Review of Systems   Review of Systems  Constitutional:  Negative for fever.  Respiratory:  Negative for wheezing and stridor.   Gastrointestinal:  Negative for abdominal pain.  Genitourinary:  Positive for vaginal bleeding. Negative for dysuria.    Physical Exam Updated Vital Signs BP (!) 140/77 (BP Location: Right Arm)   Pulse 93   Temp 98.1 F (36.7 C)   Resp 19   LMP 11/26/2023   SpO2 99%  Physical Exam Vitals and nursing note reviewed. Exam conducted with a chaperone present.  Constitutional:      General: She is not in acute distress.    Appearance: She is well-developed.  HENT:     Head: Normocephalic and atraumatic.     Nose: Nose normal.  Eyes:     Pupils: Pupils are equal, round, and reactive to light.  Cardiovascular:     Rate and Rhythm: Normal rate and regular rhythm.     Pulses: Normal pulses.     Heart sounds: Normal heart sounds.  Pulmonary:     Effort: Pulmonary effort is normal. No respiratory distress.     Breath sounds: Normal breath sounds.  Abdominal:     General: Bowel sounds are normal. There is no distension.     Palpations: Abdomen  is soft.     Tenderness: There is no abdominal tenderness. There is no guarding or rebound.  Genitourinary:    Comments: Scant blood in vault, no bleeding per os  Musculoskeletal:        General: Normal range of motion.     Cervical back: Neck supple.  Skin:    General: Skin is warm and dry.     Capillary Refill: Capillary refill takes less than 2 seconds.     Findings: No erythema or rash.  Neurological:     General: No focal deficit present.     Deep Tendon Reflexes: Reflexes normal.  Psychiatric:        Mood and Affect: Mood normal.     ED Results / Procedures / Treatments   Labs (all labs ordered are listed, but only abnormal results are displayed) Labs Reviewed  CBC - Abnormal; Notable for the  following components:      Result Value   RBC 3.79 (*)    Hemoglobin 10.0 (*)    HCT 31.8 (*)    nRBC 0.7 (*)    All other components within normal limits  PREGNANCY, URINE    EKG None  Radiology No results found.  Procedures Procedures    Medications Ordered in ED Medications - No data to display  ED Course/ Medical Decision Making/ A&P                                 Medical Decision Making Patient with clots x 1 week, irregular cycles   Amount and/or Complexity of Data Reviewed External Data Reviewed: notes.    Details: Previous notes reviewed  Labs: ordered.    Details: Negative pregnancy test.  Normal Nannini count, hemoglobin slight low 10, normal platelets  Risk Prescription drug management. Risk Details: Well appearing. No bleeding on exam.  Will start provera and refer to GYN for ongoing care.  Stable for discharge.  Strict return     Final Clinical Impression(s) / ED Diagnoses Final diagnoses:  Menorrhagia with irregular cycle   I have reviewed the triage vital signs and the nursing notes. Pertinent labs & imaging results that were available during my care of the patient were reviewed by me and considered in my medical decision making (see chart for details). After history, exam, and medical workup I feel the patient has been appropriately medically screened and is safe for discharge home. Pertinent diagnoses were discussed with the patient. Patient was given return precautions.    Rx / DC Orders ED Discharge Orders          Ordered    medroxyPROGESTERone (PROVERA) 5 MG tablet  Daily        12/04/23 0000              Leisa Gault, MD 12/04/23 0004

## 2023-12-17 ENCOUNTER — Encounter (HOSPITAL_COMMUNITY): Payer: Self-pay

## 2023-12-17 ENCOUNTER — Inpatient Hospital Stay (HOSPITAL_COMMUNITY)
Admission: EM | Admit: 2023-12-17 | Discharge: 2023-12-20 | DRG: 760 | Disposition: A | Discharge status: Home / Self Care | Payer: Self-pay | Attending: Internal Medicine | Admitting: Internal Medicine

## 2023-12-17 DIAGNOSIS — E66813 Obesity, class 3: Secondary | ICD-10-CM | POA: Diagnosis present

## 2023-12-17 DIAGNOSIS — N939 Abnormal uterine and vaginal bleeding, unspecified: Secondary | ICD-10-CM | POA: Diagnosis present

## 2023-12-17 DIAGNOSIS — E039 Hypothyroidism, unspecified: Secondary | ICD-10-CM | POA: Insufficient documentation

## 2023-12-17 DIAGNOSIS — N920 Excessive and frequent menstruation with regular cycle: Principal | ICD-10-CM | POA: Diagnosis present

## 2023-12-17 DIAGNOSIS — Z85850 Personal history of malignant neoplasm of thyroid: Secondary | ICD-10-CM

## 2023-12-17 DIAGNOSIS — F32A Depression, unspecified: Secondary | ICD-10-CM | POA: Insufficient documentation

## 2023-12-17 DIAGNOSIS — Z79899 Other long term (current) drug therapy: Secondary | ICD-10-CM

## 2023-12-17 DIAGNOSIS — E876 Hypokalemia: Secondary | ICD-10-CM | POA: Diagnosis present

## 2023-12-17 DIAGNOSIS — Z79891 Long term (current) use of opiate analgesic: Secondary | ICD-10-CM | POA: Diagnosis present

## 2023-12-17 DIAGNOSIS — Z79890 Hormone replacement therapy (postmenopausal): Secondary | ICD-10-CM

## 2023-12-17 DIAGNOSIS — D649 Anemia, unspecified: Principal | ICD-10-CM | POA: Diagnosis present

## 2023-12-17 DIAGNOSIS — F1120 Opioid dependence, uncomplicated: Secondary | ICD-10-CM | POA: Diagnosis present

## 2023-12-17 DIAGNOSIS — Z5971 Insufficient health insurance coverage: Secondary | ICD-10-CM

## 2023-12-17 DIAGNOSIS — F17210 Nicotine dependence, cigarettes, uncomplicated: Secondary | ICD-10-CM | POA: Diagnosis present

## 2023-12-17 DIAGNOSIS — Z6841 Body Mass Index (BMI) 40.0 and over, adult: Secondary | ICD-10-CM

## 2023-12-17 HISTORY — DX: Morbid (severe) obesity due to excess calories: E66.01

## 2023-12-17 HISTORY — DX: Anemia, unspecified: D64.9

## 2023-12-17 LAB — URINALYSIS, ROUTINE W REFLEX MICROSCOPIC
Bilirubin Urine: NEGATIVE
Glucose, UA: NEGATIVE mg/dL
Ketones, ur: NEGATIVE mg/dL
Leukocytes,Ua: NEGATIVE
Nitrite: NEGATIVE
Protein, ur: 30 mg/dL — AB
RBC / HPF: >50 RBC/hpf (ref 0–5)
Specific Gravity, Urine: 1.019 (ref 1.005–1.030)
pH: 5 (ref 5.0–8.0)

## 2023-12-17 LAB — CBC WITH DIFFERENTIAL/PLATELET
Abs Immature Granulocytes: 0 10*3/uL (ref 0.00–0.07)
Basophils Absolute: 0 10*3/uL (ref 0.0–0.1)
Basophils Relative: 0 %
Eosinophils Absolute: 0.2 10*3/uL (ref 0.0–0.5)
Eosinophils Relative: 3 %
HCT: 18.6 % — ABNORMAL LOW (ref 36.0–46.0)
Hemoglobin: 5.7 g/dL — CL (ref 12.0–15.0)
Lymphocytes Relative: 15 %
Lymphs Abs: 1 10*3/uL (ref 0.7–4.0)
MCH: 26.2 pg (ref 26.0–34.0)
MCHC: 30.1 g/dL (ref 30.0–36.0)
MCV: 86.9 fL (ref 80.0–100.0)
Monocytes Absolute: 0 10*3/uL — ABNORMAL LOW (ref 0.1–1.0)
Monocytes Relative: 0 %
Neutro Abs: 5.5 10*3/uL (ref 1.7–7.7)
Neutrophils Relative %: 82 %
Platelets: 237 10*3/uL (ref 150–400)
RBC: 2.14 MIL/uL — ABNORMAL LOW (ref 3.87–5.11)
RDW: 16.5 % — ABNORMAL HIGH (ref 11.5–15.5)
WBC: 6.7 10*3/uL (ref 4.0–10.5)
nRBC: 2 /100{WBCs} — ABNORMAL HIGH
nRBC: 3.6 % — ABNORMAL HIGH (ref 0.0–0.2)

## 2023-12-17 LAB — BASIC METABOLIC PANEL
Anion gap: 9 (ref 5–15)
BUN: 9 mg/dL (ref 6–20)
CO2: 25 mmol/L (ref 22–32)
Calcium: 8.3 mg/dL — ABNORMAL LOW (ref 8.9–10.3)
Chloride: 101 mmol/L (ref 98–111)
Creatinine, Ser: 0.78 mg/dL (ref 0.44–1.00)
GFR, Estimated: >60 mL/min (ref >60)
Glucose, Bld: 153 mg/dL — ABNORMAL HIGH (ref 70–99)
Potassium: 3.2 mmol/L — ABNORMAL LOW (ref 3.5–5.1)
Sodium: 135 mmol/L (ref 135–145)

## 2023-12-17 LAB — ABO/RH: ABO/RH(D): O POS

## 2023-12-17 LAB — PREPARE RBC (CROSSMATCH)

## 2023-12-17 MED ORDER — SODIUM CHLORIDE 0.9% IV SOLUTION: Freq: Once | INTRAVENOUS | Status: AC

## 2023-12-17 MED ORDER — MEGESTROL ACETATE 40 MG PO TABS
40.0000 mg | ORAL_TABLET | Freq: Every day | ORAL | Status: DC
  Administered 2023-12-18 – 2023-12-19 (×3): 40 mg via ORAL
  Filled 2023-12-17 (×4): qty 1

## 2023-12-17 NOTE — ED Provider Triage Note (Signed)
 Emergency Medicine Provider Triage Evaluation Note  Catherine Nelson , a 38 y.o. female  was evaluated in triage.  Pt complains of heavy menstrual bleeding.  She has been on progesterone, continues to have heavy menstrual bleeding, outpatient hemoglobin of 6.  Review of Systems  Positive: Low hemoglobin Negative: Fever  Physical Exam  BP (!) 147/70 (BP Location: Right Arm)   Pulse 95   Temp 98.9 F (37.2 C)   Resp 20   LMP 11/26/2023   SpO2 98%  Gen:   Awake, no distress   Resp:  Normal effort  MSK:   Moves extremities without difficulty  Other:    Medical Decision Making  Medically screening exam initiated at 7:07 PM.  Appropriate orders placed.  Catherine Nelson was informed that the remainder of the evaluation will be completed by another provider, this initial triage assessment does not replace that evaluation, and the importance of remaining in the ED until their evaluation is complete.    Arthor Captain, PA-C 12/17/23 1610

## 2023-12-17 NOTE — ED Provider Notes (Signed)
 [place] EMERGENCY DEPARTMENT AT [facility] Provider Note   CSN: [id number] Arrival date & time: 12/17/23  1829    History  Chief Complaint  Patient presents with   Abnormal Lab    Catherine Nelson is a 38 y.o. female history of thyroid cancer s/p thyroidectomy here for evaluation of abnormal lab.  Patient has had 3 weeks of vaginal bleeding.  After 1 week of bleeding she was seen here in ED.  She had hemoglobin of 10.  She was started on Provera.  This somewhat helped however she continued to have vaginal bleeding, passing large clots the size of her hand.  She was recently seen by her PCP who restarted her Provera, 5 days ago.  Still having bleeding.  She is having multiple near syncopal episodes.  She states she gets up to walk feels very short of breath, lightheaded and fatigued and has to sit down.  States multiple members of her family have had have hysterectomies due to recurrent vaginal bleeding.  She has no current abdominal pain. Not followed by Obgyn    HPI     Home Medications Prior to Admission medications   Medication Sig Start Date End Date Taking? Authorizing Provider  ferrous sulfate 325 (65 FE) MG tablet Take 1 tablet (325 mg total) by mouth daily. 12/19/15  Yes West, Emily, PA-C  ibuprofen (ADVIL) 800 MG tablet Take 800 mg by mouth every 8 (eight) hours as needed for mild pain (pain score 1-3).   Yes [provider]  levothyroxine (SYNTHROID, LEVOTHROID) 112 MCG tablet Take 112 mcg by mouth daily before breakfast.   Yes [provider]  loperamide (IMODIUM A-D) 2 MG tablet Take 1 tablet (2 mg total) by mouth 4 (four) times daily as needed for diarrhea or loose stools. 02/16/15  Yes Vanetta Mulders, MD  loratadine (CLARITIN) 10 MG tablet Take 10 mg by mouth daily.   Yes [provider]  medroxyPROGESTERone (PROVERA) 5 MG tablet Take 1 tablet (5 mg total) by mouth daily. Patient taking differently: Take 2.5 mg by mouth daily. 12/04/23   Yes Palumbo, April, MD  methadone (DOLOPHINE) 10 MG/5ML solution Take 41 mg by mouth daily.   Yes [provider]  cephALEXin (KEFLEX) 500 MG capsule Take 1 capsule (500 mg total) by mouth 4 (four) times daily. Patient not taking: Reported on 12/17/2023 03/27/23   Molpus, Jonny Ruiz, MD  doxycycline (VIBRAMYCIN) 100 MG capsule Take 1 capsule (100 mg total) by mouth 2 (two) times daily. One po bid x 7 days Patient not taking: Reported on 12/17/2023 04/07/23   Molpus, John, MD  fluconazole (DIFLUCAN) 150 MG tablet Take 1 tablet as needed for vaginal yeast infection.  May repeat in 3 days if symptoms persist. Patient not taking: Reported on 12/17/2023 03/27/23   Molpus, Jonny Ruiz, MD  predniSONE (DELTASONE) 20 MG tablet Take 2 tablets (40 mg total) by mouth daily. Patient not taking: Reported on 12/17/2023 10/17/21   Prosperi, Christian H, PA-C      Allergies    Patient has no known allergies.    Review of Systems   Review of Systems  Constitutional: Negative.   HENT: Negative.    Respiratory:  Positive for shortness of breath.   Cardiovascular: Negative.   Gastrointestinal: Negative.   Genitourinary:  Positive for menstrual problem and vaginal bleeding. Negative for vaginal discharge and vaginal pain.  Musculoskeletal: Negative.   Neurological: Negative.   All other systems reviewed and are negative.   Physical Exam  Updated Vital Signs BP (!) 147/70 (BP Location: Right Arm)   Pulse 95   Temp 98.9 F (37.2 C)   Resp 20   LMP 11/26/2023   SpO2 98%  Physical Exam Vitals and nursing note reviewed.  Constitutional:      General: She is not in acute distress.    Appearance: She is well-developed. She is obese. She is not ill-appearing, toxic-appearing or diaphoretic.  HENT:     Head: Atraumatic.     Nose: Nose normal.     Mouth/Throat:     Mouth: Mucous membranes are moist.  Eyes:     Pupils: Pupils are equal, round, and reactive to light.  Cardiovascular:     Rate and Rhythm: Normal rate.      Pulses: Normal pulses.     Heart sounds: Normal heart sounds.  Pulmonary:     Effort: Pulmonary effort is normal. No respiratory distress.     Breath sounds: Normal breath sounds.  Abdominal:     General: Bowel sounds are normal. There is no distension.     Palpations: Abdomen is soft.     Tenderness: There is no abdominal tenderness.  Genitourinary:    Comments: declined Musculoskeletal:        General: Normal range of motion.     Cervical back: Normal range of motion.  Skin:    General: Skin is warm and dry.  Neurological:     General: No focal deficit present.     Mental Status: She is alert.  Psychiatric:        Mood and Affect: Mood normal.    ED Results / Procedures / Treatments   Labs (all labs ordered are listed, but only abnormal results are displayed) Labs Reviewed  CBC WITH DIFFERENTIAL/PLATELET - Abnormal; Notable for the following components:      Result Value   RBC 2.14 (*)    Hemoglobin 5.7 (*)    HCT 18.6 (*)    RDW 16.5 (*)    nRBC 3.6 (*)    Monocytes Absolute 0.0 (*)    nRBC 2 (*)    All other components within normal limits  BASIC METABOLIC PANEL - Abnormal; Notable for the following components:   Potassium 3.2 (*)    Glucose, Bld 153 (*)    Calcium 8.3 (*)    All other components within normal limits  URINALYSIS, ROUTINE W REFLEX MICROSCOPIC - Abnormal; Notable for the following components:   APPearance HAZY (*)    Hgb urine dipstick LARGE (*)    Protein, ur 30 (*)    Bacteria, UA RARE (*)    All other components within normal limits  TYPE AND SCREEN  ABO/RH  PREPARE RBC (CROSSMATCH)    EKG None  Radiology No results found.  Procedures .Critical Care  Performed by: Linwood Dibbles, PA-C Authorized by: Linwood Dibbles, PA-C   Critical care provider statement:    Critical care time (minutes):  35   Critical care was necessary to treat or prevent imminent or life-threatening deterioration of the following conditions:   Circulatory failure   Critical care was time spent personally by me on the following activities:  Development of treatment plan with patient or surrogate, discussions with consultants, evaluation of patient's response to treatment, examination of patient, ordering and review of laboratory studies, ordering and review of radiographic studies, ordering and performing treatments and interventions, pulse oximetry, re-evaluation of patient's condition and review of old charts     Medications Ordered  in ED Medications  0.9 %  sodium chloride infusion (Manually program via Guardrails IV Fluids) (has no administration in time range)  megestrol (MEGACE) tablet 40 mg (has no administration in time range)   ED Course/ Medical Decision Making/ A&P 40-year-old here for valuation of vaginal bleeding this issue for patient over the last 3 weeks.  This is the third time she is being assessed for this.  She was seen in the emergency department about 2 weeks ago started on Provera.  Hemoglobin 10 at that time.  Symptoms had improved her bleeding did not completely stop.  After stopping the medication she had increase in her bleeding seen by her PCP at the [facility] who restarted her on Provera.  She is also on iron supplementation.  She has had multiple near syncopal episodes at home, shortness of breath, lightheadedness, dizziness which sounds orthostatic in nature.  On arrival she is afebrile, nonseptic however does appear very pale and appears ill.  Her abdomen is soft, nontender.  Will plan on repeat labs, touch base with OB/GYN  Labs personally viewed and interpreted:  CBC without leukocytosis, hemoglobin 5.7--10 2 weeks ago Metabolic panel potassium 3.2 UA negative for infection, does show blood likely due to menses Preg test at PCP neg- states has not been sexually active since prior test  CONSULT with Dr. Jolayne Panther, obgyn-  megace- 40 mg daily, increased to 40 bid if not improved. [facility] for  close FU  Discussed with patient.  Given patient almost having her hemoglobin with the last 2 weeks we will admit for observation overnight to see if she has some improvement in her near syncope with ambulation.  Patient will be admitted for severe symptomatic anemia likely due to heavy menstrual cycles.  CONSULT with Dr. Toniann Fail agrees to eval patient for admission   The patient appears reasonably stabilized for admission considering the current resources, flow, and capabilities available in the ED at this time, and I doubt any other [facility] requiring further screening and/or treatment in the ED prior to admission.                                 Medical Decision Making Amount and/or Complexity of Data Reviewed Independent Historian: friend External Data Reviewed: labs, radiology and notes. Labs: ordered. Decision-making details documented in ED Course.  Risk OTC drugs. Prescription drug management. Parenteral controlled substances. Decision regarding hospitalization. Diagnosis or treatment significantly limited by social determinants of health.         Final Clinical Impression(s) / ED Diagnoses Final diagnoses:  Symptomatic anemia  Vaginal bleeding    Rx / DC Orders ED Discharge Orders     None         Nikitas Davtyan A, PA-C 12/17/23 2222    Gwyneth Sprout, MD 12/17/23 2243

## 2023-12-17 NOTE — ED Triage Notes (Signed)
 Pt is coming in for a low hgb with a reported level of 6 by the PA. Pt has had vaginal bleed x 3 weeks, she has been on progesterone for 1 week with no resolved symptoms. She appears very pale in triage, with extreme fatigue, dizziness. Has had multiple near syncopal events outside of home.

## 2023-12-18 ENCOUNTER — Encounter (HOSPITAL_COMMUNITY): Payer: Self-pay | Admitting: Internal Medicine

## 2023-12-18 ENCOUNTER — Other Ambulatory Visit: Payer: Self-pay

## 2023-12-18 DIAGNOSIS — E039 Hypothyroidism, unspecified: Secondary | ICD-10-CM

## 2023-12-18 DIAGNOSIS — F1120 Opioid dependence, uncomplicated: Secondary | ICD-10-CM

## 2023-12-18 DIAGNOSIS — D649 Anemia, unspecified: Secondary | ICD-10-CM

## 2023-12-18 DIAGNOSIS — N920 Excessive and frequent menstruation with regular cycle: Secondary | ICD-10-CM | POA: Diagnosis present

## 2023-12-18 DIAGNOSIS — F32A Depression, unspecified: Secondary | ICD-10-CM

## 2023-12-18 DIAGNOSIS — N939 Abnormal uterine and vaginal bleeding, unspecified: Secondary | ICD-10-CM

## 2023-12-18 HISTORY — DX: Opioid dependence, uncomplicated: F11.20

## 2023-12-18 HISTORY — DX: Hypothyroidism, unspecified: E03.9

## 2023-12-18 HISTORY — DX: Depression, unspecified: F32.A

## 2023-12-18 HISTORY — DX: Abnormal uterine and vaginal bleeding, unspecified: N93.9

## 2023-12-18 LAB — CBC
HCT: 22.4 % — ABNORMAL LOW (ref 36.0–46.0)
Hemoglobin: 6.8 g/dL — CL (ref 12.0–15.0)
MCH: 26.5 pg (ref 26.0–34.0)
MCHC: 30.4 g/dL (ref 30.0–36.0)
MCV: 87.2 fL (ref 80.0–100.0)
Platelets: 242 10*3/uL (ref 150–400)
RBC: 2.57 MIL/uL — ABNORMAL LOW (ref 3.87–5.11)
RDW: 15.7 % — ABNORMAL HIGH (ref 11.5–15.5)
WBC: 7.3 10*3/uL (ref 4.0–10.5)
nRBC: 3.1 % — ABNORMAL HIGH (ref 0.0–0.2)

## 2023-12-18 LAB — HEMOGLOBIN AND HEMATOCRIT, BLOOD
HCT: 21.2 % — ABNORMAL LOW (ref 36.0–46.0)
HCT: 22.3 % — ABNORMAL LOW (ref 36.0–46.0)
Hemoglobin: 6.5 g/dL — CL (ref 12.0–15.0)
Hemoglobin: 7 g/dL — ABNORMAL LOW (ref 12.0–15.0)

## 2023-12-18 LAB — PREGNANCY, URINE: Preg Test, Ur: NEGATIVE

## 2023-12-18 LAB — BASIC METABOLIC PANEL
Anion gap: 10 (ref 5–15)
BUN: 11 mg/dL (ref 6–20)
CO2: 28 mmol/L (ref 22–32)
Calcium: 8.5 mg/dL — ABNORMAL LOW (ref 8.9–10.3)
Chloride: 101 mmol/L (ref 98–111)
Creatinine, Ser: 0.74 mg/dL (ref 0.44–1.00)
GFR, Estimated: >60 mL/min (ref >60)
Glucose, Bld: 132 mg/dL — ABNORMAL HIGH (ref 70–99)
Potassium: 3.8 mmol/L (ref 3.5–5.1)
Sodium: 139 mmol/L (ref 135–145)

## 2023-12-18 LAB — HIV ANTIBODY (ROUTINE TESTING W REFLEX): HIV Screen 4th Generation wRfx: NONREACTIVE

## 2023-12-18 LAB — TSH: TSH: 3.932 u[IU]/mL (ref 0.350–4.500)

## 2023-12-18 MED ORDER — INFLUENZA VIRUS VACC SPLIT PF (FLUZONE) 0.5 ML IM SUSY: 0.5000 mL | PREFILLED_SYRINGE | INTRAMUSCULAR | Status: DC

## 2023-12-18 MED ORDER — MEGESTROL ACETATE 40 MG PO TABS
40.0000 mg | ORAL_TABLET | Freq: Every day | ORAL | Status: DC
Start: 2023-12-19 — End: 2023-12-19
  Filled 2023-12-18: qty 1

## 2023-12-18 MED ORDER — METHADONE HCL 10 MG PO TABS
40.0000 mg | ORAL_TABLET | Freq: Every day | ORAL | Status: DC
  Administered 2023-12-18 – 2023-12-20 (×3): 40 mg via ORAL
  Filled 2023-12-18 (×3): qty 4

## 2023-12-18 MED ORDER — POTASSIUM CHLORIDE 20 MEQ PO PACK
20.0000 meq | PACK | Freq: Once | ORAL | Status: AC
  Administered 2023-12-18: 20 meq via ORAL
  Filled 2023-12-18: qty 1

## 2023-12-18 MED ORDER — FERROUS SULFATE 325 (65 FE) MG PO TABS
325.0000 mg | ORAL_TABLET | Freq: Every day | ORAL | Status: DC
  Administered 2023-12-18 – 2023-12-20 (×3): 325 mg via ORAL
  Filled 2023-12-18 (×3): qty 1

## 2023-12-18 MED ORDER — PNEUMOCOCCAL 20-VAL CONJ VACC 0.5 ML IM SUSY
0.5000 mL | PREFILLED_SYRINGE | INTRAMUSCULAR | Status: DC
  Filled 2023-12-18: qty 0.5

## 2023-12-18 MED ORDER — LEVOTHYROXINE SODIUM 112 MCG PO TABS
112.0000 ug | ORAL_TABLET | Freq: Every day | ORAL | Status: DC
  Administered 2023-12-18 – 2023-12-20 (×3): 112 ug via ORAL
  Filled 2023-12-18 (×3): qty 1

## 2023-12-18 NOTE — Progress Notes (Addendum)
 Same day note  Catherine Nelson is a 38 y.o. female with history of partial thyroidectomy for thyroid cancer on thyroid replacement therapy, depression, chronic methadone therapy presented to hospital with severe vaginal bleeding for last 3 weeks.  Patient had presented to ED on  12/04/2023 and patient was prescribed 5 mg Provera but despite taking Provera she was continuing to have vaginal bleeding which was heavy.  She then went to see her primary care physician 5 days ago and was given another prescription.  Despite that patient continued to have vaginal bleeding and had fatigue weakness and some dizziness on exertion.  States that that she does have a strong family history of vaginal bleeding to the point that family was had to go through hysterectomy.  Does not have insurance at this time to follow-up with gynecologist.    In the ED patient was noted to have hemoglobin of 5.7. ER physician discussed with on-call OB/GYN Dr. Dr. Jolayne Panther who advised starting patient on Megace 40 mg and since patient is symptomatic 2 units of PRBC transfusion was ordered.  Patient admitted for further observation.  Patient seen and examined at bedside.  Patient was admitted to the hospital for vaginal bleeding.  At the time of my evaluation, patient complains of ongoing vaginal bleeding fatigue weakness.  Physical examination reveals obese built female, pallor   Laboratory data and imaging was reviewed  Assessment and Plan.  Symptomatic anemia secondary to severe menorrhagia -   status post 2 units of PRBC transfusion.  ED had spoken with OB/GYN Dr Jolayne Panther who recommended Megace if not improving might need to double it up.   If patient's bleeding is still persistent reconsult OB/GYN in the morning.  Check hemoglobin every 12 hourly.  Latest hemoglobin of 6.5.  Hypothyroidism with prior history of partial thyroidectomy for thyroid cancer, continue  Synthroid.  Check TSH.  Depression on antidepressant.  Not listed  in the med rec.  On chronic methadone therapy. Continue on methadone while in the hospital.  Patient gets methadone from the clinic.  Mild hypokalemia improved after replacement.  Potassium of 3.8.  No Charge  Signed,  Tenny Craw, MD [facility]

## 2023-12-18 NOTE — Discharge Instructions (Signed)
 Low Income Public Housing  [facility] Housing Authority [employer] Supported Living Apts [phone] [address] [facility] [phone]  [facility] [address], [place] [phone]  [facility] [address] [place] [phone]  [facility] [address] [place]. [place] [phone]  [place] A[address] [place] [phone]  [place] Apts[address]  [address] [place] [phone]  [employer] [employer] [address] [place] [phone]  [facility] [address] [place]vio Pate [phone]  [facility] [address], [place] [phone]  THP Apts[address],  [facility] [phone]  [facility]  [address] [place] [phone]  [place] A[address]  [address] [facility] [address] [place] Otto [phone]   [place] [address] II [address] [place] [phone]  [place] [facility] [address] [place] Otto [phone] [address] [place] [phone]  [facility]. [address], [place] Otto [phone]  [place] Apts[address] [facility] [address] [place] Otto [phone]  [facility] Apts. [address] [place] [phone]  [employer] Apts.  [address] [place] [phone]  [facility] Apts. [address], [place] [phone] Apts.  [address] [phone]  [facility] Apts. [address] [place] [phone]  [place] Apts.  [address] [place] Allegra [phone]    SUNDAYS BREAKFAST TWO LOCATIONS: 8:00am served in [facility] by Awaken PPL Corporation 8:30am SHUTTLE provided from [facility], served at Apache Corporation, [address] LUNCH TWO LOCATIONS [plus one additional third Sunday only] 10:30am - 12:30pm served at Ecolab, Liberty Global, [place] [address] (1.2 miles from [facility]) 12:30pm served in [place] by Land O'Lakes Team (THIRD Sunday only) 1:30pm served at [facility] by [facility] one location [plus one additional third Sunday only] 5:00pm Every Sunday, served under the bridge at [address] by Lindell Noe Under the 3M Company (.7 miles from [facility]) (THIRD Sunday ONLY) 4:00pm served in the parking garage, across from Nucor Corporation, corner of [place] and [place] by Ryland Group Works Ministries MONDAYS BREAKFAST 7:30am served in Nucor Corporation by the United States Steel Corporation and Friends LUNCH 10:30am - 12:30pm served at Ecolab, Liberty Global, [place] [address] (1.2 miles from [facility]) DINNER TWO LOCATIONS: 7:00pm served in front of the courthouse at the corner of Goldman Sachs and International Business Machines. by [facility] Monday Night Meal (3 blocks from [facility]) 4:30pm served at the AutoNation, [address] by The Procter & Gamble Not Bombs (0.6 miles from [facility]) [place] BREAKFAST 8:00am - 9:00am served at The TJX Companies, [address] (0.3 miles from [place]) LUNCH 10:30am - 12:30pm served at the Ecolab, Liberty Global [address] (1.2 miles from [place]) DINNER 6:00pm served at CSX Corporation, enter from Capital One and go to the Sonic Automotive, (0.7 miles from [place]) [facility] BREAKFAST 7:00am - 8:00am served at Ecolab, Liberty Global [address] (1.2 miles from [place]) LUNCH ONE LOCATION [plus two additional locations listed below] 10:30am - 12:30pm served at [employer]colab, Liberty Global [address], (1.2 miles from [place]) (FIRST Wednesday ONLY) 11:30am served at Dillard's, [place] [address] (6.6 miles from [place]) (SECOND Wednesday  ONLY) 11:00am served at R.R. Donnelley. Owens & Minor of [address], [address] (1.3 miles from [place]) [place] TWO LOCATIONS 6:00pm served at W. R. Berkley, [place] W. Visteon Corporation. (1.3 miles from [facility]) 4:00pm - 6:00pm (hot dogs and chips) served at Levi Strauss of [facility], [address] (1.7 miles from [place]) [place] BREAKFAST NOT AVAILABLE AT THIS TIME LUNCH 10:30am - 12:30pm served at Ecolab, Liberty Global, [place] [address] (1.2 miles from [place]) DINNER 6:00pm served at CSX Corporation, enter from Capital One and go to the Sonic Automotive, (0.7 miles from Nucor Corporation) [place] BREAKFAST NOT AVAILABLE AT THIS TIME LUNCH 10:30am - 12:30pm served at Ecolab, Liberty Global [address], (1.2 miles from [place]) DINNER TWO LOCATIONS, [plus one additional first Friday only] 6:00pm served under the bridge at [address] by Lindell Noe Under CSX Corporation. (.7 miles from [facility]) 5:00pm - 7:00pm served at Levi Strauss of [facility], [address] (1.7 miles from [place]) (FIRST Friday ONLY) 5:45 pm - SHUTTLE provided from the LIBRARY at 5:45pm. Served at [facility], [address]. SATURDAYS BREAKFAST TWO LOCATIONS [plus one additional last Saturday only] 8:00am served at [facility] by Delphi 8:30am served at Pulte Homes, [address]. (2.2 miles from [facility]) (LAST Saturday ONLY) 8:30am served at Beazer Homes, [address] (5 miles from [place]) LUNCH 10:30am - 12:30pm served at Ecolab, Liberty Global [address] (1.2 miles from [facility]) DINNER 6:00pm served under the bridge at [address] by World Fuel Services Corporation (0.7 miles from Duke Energy)  DIRECTIONS FROM [place] TO ALL MEAL LOCATIONS The Bridge at [address] (.7 miles from [address]) [address] on [place]. Turn Right onto DIRECTV 433 ft. Continue onto [address] under bridge, about 500 ft. Courthouse (3 blocks from [facility]) [place] on [address]. Turn right on [place] 1 block to PPL Corporation (.5 miles from [place]) [place] on [place]. YRC Worldwide. past Brink's Company to EMCOR. Enter from Capital One and go to the Affiliated Computer Services building W. R. Berkley [address] Visteon [address]. (1.3 miles from [facility]) [address] on [place]. Turn Right onto W. [address]. church will be on the Left. The TJX Companies [address] (.3 miles from [facility]) Go .3 miles on W. [address] Destination is on your right Dillard's at ONEOK (6.6 miles from [address]) [address] on [place] toward W [address]ly Turn right onto W Friendly Continue onto Alcoa Inc. Continue onto Toll Brothers. 5. Elesa Hacker is on right Csf - [facility] Conseco) [address] (.6 miles from [address]) [place] on [place]. [address] Turn Left onto [address] 0.3 miles Destination is on the Left. [place] Black & Decker at American Express (5 miles from Nucor Corporation) 1. Head south on [address]. Turn right onto [address] Friend[address] Turn slightly left onto Quest Diagnostics Continue onto Quest Diagnostics Turn right at Barnes & Noble Continue to church on right New Birth Sounds of [facility] [place] [facility]  [address] (1.7 miles from [facility]) [address] on [place] 1.4 miles [place] becomes [place]. [address] Continue 0.6 miles and church will be on theright. Northside Guardian Life Insurance at [address] (2.5 miles from Nucor Corporation) [place] provided from Massachusetts Mutual Life Park] [place] on [place]. Elm toward Estée Lauder right onto Rockwell Automation left onto Henry Schein left onto Micron Technology [address] Southern Company (2.2 miles from [address]) [address] on [place] 1.4 miles [place] becomes [place]. [place] Turn right onto [address] and church will be on the Left. Potter's House/[place] AT&T [address] (1.2 miles from [facility]) 1.Turn right onto [facility] 2.Turn left onto Rogue Jury 3.Reino Kent 4.Destination is on your right [place]. Melvyn Novas of [address] at ToysRus (1.3 miles from [facility]) [address] on [address] Turn left onto Genuine Parts right onto S. Qu[address] [place]. Continue onto KB Home	[place]. Turn left onto Smurfit-Stone Container. Turn right onto WellPoint.

## 2023-12-18 NOTE — Progress Notes (Signed)
   12/18/23 1512  TOC Brief Assessment  Insurance and Status Reviewed  Patient has primary care physician Yes  Home environment has been reviewed boyfriend  Prior level of function: independent  Prior/Current Home Services No current home services  Social Drivers of Health Review SDOH reviewed interventions complete  Transition of care needs no transition of care needs at this time   Spoke to patient at bedside.   PCP Catherine Nelson   Patient from home with boyfriend.   Patient goes to [facility] [place] every 2 weeks. She has applied for medicaid last week. Financial Counselor referral done.   [facility] pharmacy [facility]      Transition of Care Department [facility]) has reviewed patient and  will continue to monitor patient advancement through interdisciplinary progression rounds. If new patient transition needs arise, please place a TOC consult.

## 2023-12-18 NOTE — Progress Notes (Signed)
   12/18/23 1516  SDOH Interventions  Housing Interventions Inpatient TOC   Resources placed on AVS

## 2023-12-18 NOTE — Progress Notes (Signed)
 [facility] Liana Crocker NP notified cardiac monitoring expires @ 2200

## 2023-12-18 NOTE — ED Notes (Signed)
 Collect labs @430 

## 2023-12-18 NOTE — H&P (Addendum)
 History and Physical    Catherine Nelson ZOX:[id number] DOB: 1986/04/07 DOA: 12/17/2023  Patient coming from: Home.  Chief Complaint: Weakness.  HPI: Catherine Nelson is a 38 y.o. female with history of partial thyroidectomy for thyroid cancer on thyroid replacement therapy, depression, chronic methadone therapy has been experiencing severe vaginal bleeding for the last 3 weeks.  Patient had presented to the ER on 12/04/2023 and patient was prescribed 5 mg Provera which patient has been taking despite which patient continued to have heavy vaginal bleed.  Had followed up with her primary care physician about 5 days ago and was given another prescription for Provera which patient has been taking and also had some labs drawn which showed hemoglobin of 6 which was a drop from 10 about 2 weeks ago on 12/03/2023.  Patient has been feeling fatigued weak and has been having some dizziness on exertion.  ED Course: In the ER patient hemoglobin was 5.7.  ER physician discussed with on-call OB/GYN Dr. Dr. Jolayne Panther who advised starting patient on Megace 40 mg and since patient is symptomatic 2 units of PRBC transfusion was ordered.  Patient admitted for further observation.  Review of Systems: As per HPI, rest all negative.   Past Medical History:  Diagnosis Date   Thyroid cancer [facility])     Past Surgical History:  Procedure Laterality Date   THYROIDECTOMY, PARTIAL       reports that she has been smoking cigarettes. She has never used smokeless tobacco. She reports that she does not currently use alcohol. She reports that she does not currently use drugs.  No Known Allergies  History reviewed. No pertinent family history.  Prior to Admission medications   Medication Sig Start Date End Date Taking? Authorizing Provider  ferrous sulfate 325 (65 FE) MG tablet Take 1 tablet (325 mg total) by mouth daily. 12/19/15  Yes West, Emily, PA-C  ibuprofen (ADVIL) 800 MG tablet Take 800 mg by mouth every 8 (eight) hours  as needed for mild pain (pain score 1-3).   Yes [provider]  levothyroxine (SYNTHROID, LEVOTHROID) 112 MCG tablet Take 112 mcg by mouth daily before breakfast.   Yes [provider]  loperamide (IMODIUM A-D) 2 MG tablet Take 1 tablet (2 mg total) by mouth 4 (four) times daily as needed for diarrhea or loose stools. 02/16/15  Yes Vanetta Mulders, MD  loratadine (CLARITIN) 10 MG tablet Take 10 mg by mouth daily.   Yes [provider]  medroxyPROGESTERone (PROVERA) 5 MG tablet Take 1 tablet (5 mg total) by mouth daily. Patient taking differently: Take 2.5 mg by mouth daily. 12/04/23  Yes Palumbo, April, MD  methadone (DOLOPHINE) 10 MG/5ML solution Take 41 mg by mouth daily.   Yes [provider]  cephALEXin (KEFLEX) 500 MG capsule Take 1 capsule (500 mg total) by mouth 4 (four) times daily. Patient not taking: Reported on 12/17/2023 03/27/23   Molpus, Jonny Ruiz, MD  doxycycline (VIBRAMYCIN) 100 MG capsule Take 1 capsule (100 mg total) by mouth 2 (two) times daily. One po bid x 7 days Patient not taking: Reported on 12/17/2023 04/07/23   Molpus, John, MD  fluconazole (DIFLUCAN) 150 MG tablet Take 1 tablet as needed for vaginal yeast infection.  May repeat in 3 days if symptoms persist. Patient not taking: Reported on 12/17/2023 03/27/23   Molpus, Jonny Ruiz, MD  predniSONE (DELTASONE) 20 MG tablet Take 2 tablets (40 mg total) by mouth daily. Patient not taking: Reported on 12/17/2023 10/17/21   Prosperi, Harrel Carina,  PA-C    Physical Exam: Constitutional: Moderately built and nourished. Vitals:   12/17/23 2333 12/18/23 0036 12/18/23 0103 12/18/23 0118  BP: 128/68 (!) 138/41 (!) 144/40 (!) 129/58  Pulse: 92 92 (!) 109 92  Resp: 20 20 20 20   Temp: 98.7 F (37.1 C) 99.5 F (37.5 C) 98.7 F (37.1 C) 99 F (37.2 C)  TempSrc: Oral Oral Oral Oral  SpO2: 100% 98%  98%   Eyes: Anicteric no pallor. ENMT: No discharge from the ears eyes nose or mouth. Neck: No mass felt.  No neck  rigidity. Respiratory: No rhonchi or crepitations. Cardiovascular: S1-S2 heard. Abdomen: Soft nontender bowel sound present. Musculoskeletal: No edema. Skin: No rash. Neurologic: Alert awake oriented to time place and person.  Moves all extremities. Psychiatric: Appears normal.  Normal affect.   Labs on Admission: I have personally reviewed following labs and imaging studies  CBC: Recent Labs  Lab 12/17/23 1918  WBC 6.7  NEUTROABS 5.5  HGB 5.7*  HCT 18.6*  MCV 86.9  PLT 237   Basic Metabolic Panel: Recent Labs  Lab 12/17/23 1918  NA 135  K 3.2*  CL 101  CO2 25  GLUCOSE 153*  BUN 9  CREATININE 0.78  CALCIUM 8.3*   GFR: CrCl cannot be calculated (Unknown ideal weight.). Liver Function Tests: No results for input(s): "AST", "ALT", "ALKPHOS", "BILITOT", "PROT", "ALBUMIN" in the last 168 hours. No results for input(s): "LIPASE", "AMYLASE" in the last 168 hours. No results for input(s): "AMMONIA" in the last 168 hours. Coagulation Profile: No results for input(s): "INR", "PROTIME" in the last 168 hours. Cardiac Enzymes: No results for input(s): "CKTOTAL", "CKMB", "CKMBINDEX", "TROPONINI" in the last 168 hours. BNP (last 3 results) No results for input(s): "PROBNP" in the last 8760 hours. HbA1C: No results for input(s): "HGBA1C" in the last 72 hours. CBG: No results for input(s): "GLUCAP" in the last 168 hours. Lipid Profile: No results for input(s): "CHOL", "HDL", "LDLCALC", "TRIG", "CHOLHDL", "LDLDIRECT" in the last 72 hours. Thyroid Function Tests: No results for input(s): "TSH", "T4TOTAL", "FREET4", "T3FREE", "THYROIDAB" in the last 72 hours. Anemia Panel: No results for input(s): "VITAMINB12", "FOLATE", "FERRITIN", "TIBC", "IRON", "RETICCTPCT" in the last 72 hours. Urine analysis:    Component Value Date/Time   COLORURINE YELLOW 12/17/2023 2000   APPEARANCEUR HAZY (A) 12/17/2023 2000   LABSPEC 1.019 12/17/2023 2000   PHURINE 5.0 12/17/2023 2000    GLUCOSEU NEGATIVE 12/17/2023 2000   HGBUR LARGE (A) 12/17/2023 2000   BILIRUBINUR NEGATIVE 12/17/2023 2000   KETONESUR NEGATIVE 12/17/2023 2000   PROTEINUR 30 (A) 12/17/2023 2000   UROBILINOGEN 0.2 02/16/2015 1005   NITRITE NEGATIVE 12/17/2023 2000   LEUKOCYTESUR NEGATIVE 12/17/2023 2000   Sepsis Labs: @LABRCNTIP (procalcitonin:4,lacticidven:4) )No results found for this or any previous visit (from the past 240 hours).   Radiological Exams on Admission: No results found.   Assessment/Plan Principal Problem:   Symptomatic anemia Active Problems:   Menorrhagia   Hypothyroidism   Depression   Methadone maintenance therapy patient (HCC)    Symptomatic anemia secondary to severe menorrhagia -    patient is receiving 2 units of PRBC transfusion.  ER physician had discussed with OB/GYN Dr. Jolayne Panther who advised placing patient on Megace 40 mg daily and if bleeding does not improve may have to change to twice daily.  Will observe overnight.  If patient's bleeding is still persistent reconsult OB/GYN in the morning. Hypothyroidism with prior history of partial thyroidectomy for thyroid cancer on Synthroid.  Check TSH. Depression  on antidepressant.  Medicine's name patient will confirm in the morning. On chronic methadone therapy. Mild hypokalemia replace recheck.  C screen pending.  Since patient has menorrhagia with symptomatic anemia requiring 2 units PRBC transfusion will need more than 2 midnight stay.   DVT prophylaxis: SCDs. Code Status: Full code. Family Communication: Discussed with patient. Disposition Plan: Medical floor. Consults called: ER physician discussed with OB/GYN. Admission status: Observation.

## 2023-12-18 NOTE — ED Notes (Signed)
 Pt placed on monitor.

## 2023-12-19 ENCOUNTER — Inpatient Hospital Stay (HOSPITAL_COMMUNITY): Payer: Self-pay

## 2023-12-19 LAB — HEMOGLOBIN AND HEMATOCRIT, BLOOD
HCT: 22.4 % — ABNORMAL LOW (ref 36.0–46.0)
HCT: 26.8 % — ABNORMAL LOW (ref 36.0–46.0)
Hemoglobin: 6.9 g/dL — CL (ref 12.0–15.0)
Hemoglobin: 8.6 g/dL — ABNORMAL LOW (ref 12.0–15.0)

## 2023-12-19 LAB — PREPARE RBC (CROSSMATCH)

## 2023-12-19 MED ORDER — MUSCLE RUB 10-15 % EX CREA
TOPICAL_CREAM | CUTANEOUS | Status: DC | PRN
  Administered 2023-12-19: 1 via TOPICAL
  Filled 2023-12-19: qty 85

## 2023-12-19 MED ORDER — SODIUM CHLORIDE 0.9% IV SOLUTION: Freq: Once | INTRAVENOUS | Status: AC

## 2023-12-19 MED ORDER — MEGESTROL ACETATE 40 MG PO TABS
80.0000 mg | ORAL_TABLET | Freq: Two times a day (BID) | ORAL | Status: DC
  Administered 2023-12-19 – 2023-12-20 (×2): 80 mg via ORAL
  Filled 2023-12-19 (×2): qty 2

## 2023-12-19 MED ORDER — MEGESTROL ACETATE 40 MG PO TABS
40.0000 mg | ORAL_TABLET | Freq: Once | ORAL | Status: AC
  Administered 2023-12-19: 40 mg via ORAL
  Filled 2023-12-19: qty 1

## 2023-12-19 MED ORDER — MEGESTROL ACETATE 40 MG PO TABS: 40.0000 mg | ORAL_TABLET | Freq: Two times a day (BID) | ORAL | Status: DC

## 2023-12-19 MED ORDER — ACETAMINOPHEN 325 MG PO TABS
650.0000 mg | ORAL_TABLET | ORAL | Status: DC | PRN
  Administered 2023-12-19 – 2023-12-20 (×4): 650 mg via ORAL
  Filled 2023-12-19 (×4): qty 2

## 2023-12-19 NOTE — Progress Notes (Signed)
 Patient stated that she had a small bowel movement and a small blood clot passed.

## 2023-12-19 NOTE — Progress Notes (Addendum)
 PROGRESS NOTE  Catherine Nelson AOZ:[id number] DOB: 04-19-1986 DOA: 12/17/2023 PCP: [name], NP   LOS: 1 day   Brief narrative:   Catherine Nelson is a 38 y.o. female with history of partial thyroidectomy for thyroid cancer on thyroid replacement therapy, depression, chronic methadone therapy presented to hospital with severe vaginal bleeding for last 3 weeks.  Patient had presented to ED on  12/04/2023 and patient was prescribed 5 mg Provera but despite taking Provera she was continuing to have vaginal bleeding which was heavy.  She then went to see her primary care physician 5 days ago and was given another prescription.  Despite that patient continued to have vaginal bleeding and had fatigue weakness and some dizziness on exertion.  States that that she does have a strong family history of vaginal bleeding to the point that family was had to go through hysterectomy.  Does not have insurance at this time to follow-up with gynecologist.  In the ED, patient was noted to have hemoglobin of 5.7. ER physician discussed with on-call OB/GYN Dr. Dr. Jolayne Panther who advised starting patient on Megace 40 mg and since patient is symptomatic 2 units of PRBC transfusion was ordered.  Patient was then admitted to the hospital for further observation.  Assessment/Plan: Principal Problem:   Symptomatic anemia Active Problems:   Menorrhagia   Hypothyroidism   Depression   Methadone maintenance therapy patient (HCC)  Symptomatic anemia secondary to severe menorrhagia -   status post 2 units of PRBC transfusion.  ED had spoken with OB/GYN Dr Jolayne Panther who recommended Megace.  Hemoglobin today at 6.9.  Reached out to GYN oncology on-call today and recommends Megace 80 mg twice daily for now.  Patient states that her bleeding is slightly decreased with less clots.  Gynecology to see the patient today.  Will transfuse 2 units of additional PRBC.   Hypothyroidism with prior history of partial thyroidectomy for thyroid  cancer, continue  Synthroid.  Check TSH.   Depression on antidepressant.  Not listed in the med rec.   On chronic methadone therapy. Continue on methadone while in the hospital.  Patient gets methadone from the clinic.   Mild hypokalemia improved after replacement.  Latest potassium of 3.8.  Class III obesity.Body mass index is 64.66 kg/m.  Would benefit from ongoing weight loss as outpatient.  DVT prophylaxis: SCDs Start: 12/18/23 0142   Disposition: Home likely 12/20/2023  Status is: Inpatient Remains inpatient appropriate because: Ongoing vaginal bleeding, anemia requiring blood transfusion, gynecology workup, pending improvement    Code Status:     Code Status: Full Code  Family Communication: None at bedside  Consultants: Gynecology  Procedures: Transfusion of 4 units of packed RBC  Anti-infectives:  None  Anti-infectives (From admission, onward)    None       Subjective: Today, patient was seen and examined at bedside.  Patient denies any chest pain, shortness of breath, dizziness but continues to have vaginal bleeding with less clots.  Objective: Vitals:   12/19/23 1215 12/19/23 1231  BP: 137/60 (!) 119/58  Pulse: 85 82  Resp: 18 18  Temp: 98 F (36.7 C) 98.2 F (36.8 C)  SpO2:  97%    Intake/Output Summary (Last 24 hours) at 12/19/2023 1258 Last data filed at 12/19/2023 1214 Gross per 24 hour  Intake 1048 ml  Output --  Net 1048 ml   Filed Weights   12/18/23 1356  Weight: (!) 165.6 kg   Body mass index is 64.66 kg/m.  Physical Exam:  GENERAL: Patient is alert awake and oriented. Not in obvious distress.  Morbidly obese HENT: Pallor noted.  Pupils equally reactive to light. Oral mucosa is moist NECK: is supple, no gross swelling noted. CHEST: Clear to auscultation. No crackles or wheezes.  Diminished breath sounds bilaterally. CVS: S1 and S2 heard, no murmur. Regular rate and rhythm.  ABDOMEN: Soft, non-tender, bowel sounds are present.   Obese abdomen. EXTREMITIES: No edema. CNS: Cranial nerves are intact. No focal motor deficits. SKIN: warm and dry without rashes.  Data Review: I have personally reviewed the following laboratory data and studies,  CBC: Recent Labs  Lab 12/17/23 1918 12/18/23 0448 12/18/23 0731 12/18/23 1707 12/19/23 0710  WBC 6.7 7.3  --   --   --   NEUTROABS 5.5  --   --   --   --   HGB 5.7* 6.8* 6.5* 7.0* 6.9*  HCT 18.6* 22.4* 21.2* 22.3* 22.4*  MCV 86.9 87.2  --   --   --   PLT 237 242  --   --   --    Basic Metabolic Panel: Recent Labs  Lab 12/17/23 1918 12/18/23 0448  NA 135 139  K 3.2* 3.8  CL 101 101  CO2 25 28  GLUCOSE 153* 132*  BUN 9 11  CREATININE 0.78 0.74  CALCIUM 8.3* 8.5*   Liver Function Tests: No results for input(s): "AST", "ALT", "ALKPHOS", "BILITOT", "PROT", "ALBUMIN" in the last 168 hours. No results for input(s): "LIPASE", "AMYLASE" in the last 168 hours. No results for input(s): "AMMONIA" in the last 168 hours. Cardiac Enzymes: No results for input(s): "CKTOTAL", "CKMB", "CKMBINDEX", "TROPONINI" in the last 168 hours. BNP (last 3 results) No results for input(s): "BNP" in the last 8760 hours.  ProBNP (last 3 results) No results for input(s): "PROBNP" in the last 8760 hours.  CBG: No results for input(s): "GLUCAP" in the last 168 hours. No results found for this or any previous visit (from the past 240 hours).   Studies: No results found.    Joycelyn Das, MD  [facility] 12/19/2023  If 7PM-7AM, please contact night-coverage

## 2023-12-19 NOTE — Consult Note (Signed)
 OBSTETRICS AND GYNECOLOGY ATTENDING CONSULT NOTE  Consult Date: 12/20/2023 Reason for Consult: Abnormal uterine bleeding and symptomatic anemia Consulting Provider: Dr. Rebekah Chesterfield Pokhrel    Assessment/Plan: Continue Megace to 80 mg po bid for better AUB control for now.  Once bleeding stops, patient can go down to 40 mg bid until she is seen in office.  But if heavy bleeding resumes, she can go back up to Megace 80 mg po bid. This was discussed with patient. Pelvic ultrasound is unremarkable, no anomalies noted, results discussed with patient.  AUB is likely anovulatory and hormonal. Can be seen in patient with elevated BMI, due to the abundance of estrone produced by adipose cells and the effects of the endometrium.  The best initial course of action is progesterone therapy (such as Megace) to counteract the estrogen effect on the endometrium, also discussed possibility of progestin IUD placement in office with patient.   She is hesitant to do this as this did not work for her mother, but stressed that she may have a different experience. There is no current indication for hysterectomy, this is definitive therapy but also the most invasive treatment modality.  Patient is at high risk of surgical complications, and this will not be recommended as first line therapy for her.  Reassured by her hemoglobin of 9.0, recommend that she continue oral iron therapy. NSAIDs recommended for any pelvic pain, patient has none currently. A message has been sent to [facility] for [facility] office at [facility] to arrange outpatient follow up soon for patient, patient will be contacted.  From our standpoint, she can be discharged to home today and followed outpatient.  Appreciate care of Wilhelmena Zea by her primary team. Please call [phone] [facility] OB/GYN Consult Attending Monday-Friday 8am - 5pm) or [phone] [facility] OB/GYN Attending On Call all day, every day) for any gynecologic concerns at any  time.  Thank you for involving [place] in the care of this patient.  Total consultation time including face-to-face time with patient (>50% of time), reviewing chart and documentation: 90 minutes  Jaynie Collins, MD, FACOG Obstetrician & Gynecologist, [facility] for [name], [name]    History of Present Illness: Catherine Nelson is an 38 y.o. [id number] female who was admitted for abnormal uterine bleeding for past 3 weeks.  History remarkable for  history ofd term cesarean section for twins nine years ago, morbid obesity with current BMI of 65, thyroid cancer s/p partial thyroidectomy,  chronic methadone therapy.  She presented to ED with similar complaint on 12/04/23 and was started on Provera 5 mg daily, and continued to bleed. Her PCP renewed this prescription but bleeding continued, and she reported accompanying weakness, and lightheadedness.  She re-presented to [facility] ER, and on evaluation, her hemoglobin was noted to 5.7 and she was symptomatic.  A blood transfusion was started and I was called for recommendations on 12/19/23.  I recommended initiation of higher dosage of progesterone therapy: Megace 80 mg po bid for now given the severity of her bleeding, also advised to continue transfusion as ordered and order pelvic ultrasound.  I planned to see patient yesterday, but primary responsibilities at [facility] did not allow for this.  I did communicate this to Dr. Tyson Babinski and promised I would see the patient today and review all results.  I also sent a message to our office at [facility] to make a follow up appointment for patient.  On encounter today, patient reports that  she is having scant bleeding and feels significantly better.  Reports having a long history of heavy menstrual bleeding, and sometimes skipped months which makes the subsequent periods even heavier.  Bleeding is associated with a lot of pelvic pain.  This all happened more after the birth of her  twins, and she gained a lot of weight.  She is worried about her bleeding as many women in her family had needed hysterectomy for bleeding. Denies any abnormal vaginal discharge, pelvic pain,  fevers, chills, sweats, dysuria, nausea, vomiting, other GI or GU symptoms or other general symptoms.   Pertinent OB/GYN History: Patient's last menstrual period was 11/26/2023.  OB History  Gravida Para Term Preterm AB Living  1 1 1  0 0 2  SAB IAB Ectopic Multiple Live Births  0 0 0 1 2    # Outcome Date GA Lbr Len/2nd Weight Sex Type Anes PTL Lv  1A Term     M CS-LTranv   LIV  1B Term     M CS-LTranv   LIV  No GYN issues, denies any cervical dysplasia or STIs.  Reports having normal pap smear last year.   Patient Active Problem List   Diagnosis Date Noted   Morbid obesity with BMI of 60.0-69.9, adult (HCC)    Abnormal uterine bleeding (AUB) 12/18/2023   Hypothyroidism 12/18/2023   Depression 12/18/2023   Methadone maintenance therapy patient (HCC) 12/18/2023   Symptomatic anemia 12/17/2023   Posterior tibial tendinitis of left leg 10/17/2021    Past Medical History:  Diagnosis Date   Abnormal uterine bleeding (AUB) 12/18/2023   Depression 12/18/2023   Hypothyroidism 12/18/2023   Methadone maintenance therapy patient (HCC) 12/18/2023   Morbid obesity (HCC)    Posterior tibial tendinitis of left leg 10/17/2021   Symptomatic anemia 12/17/2023   Thyroid cancer (HCC)     Past Surgical History:  Procedure Laterality Date   CESAREAN SECTION MULTI-GESTATIONAL     THYROIDECTOMY, PARTIAL      History reviewed. No pertinent family history.  Social History:  reports that she has been smoking cigarettes. She has never used smokeless tobacco. She reports that she does not currently use alcohol. She reports that she does not currently use drugs.  Allergies: No Known Allergies  Medications: I have reviewed the patient's current medications. Prior to Admission:  Medications Prior to  Admission  Medication Sig Dispense Refill Last Dose/Taking   ferrous sulfate 325 (65 FE) MG tablet Take 1 tablet (325 mg total) by mouth daily. 30 tablet 0 12/17/2023 Morning   ibuprofen (ADVIL) 800 MG tablet Take 800 mg by mouth every 8 (eight) hours as needed for mild pain (pain score 1-3).   12/17/2023 Morning   levothyroxine (SYNTHROID, LEVOTHROID) 112 MCG tablet Take 112 mcg by mouth daily before breakfast.   12/17/2023 Morning   loperamide (IMODIUM A-D) 2 MG tablet Take 1 tablet (2 mg total) by mouth 4 (four) times daily as needed for diarrhea or loose stools. 30 tablet 0 Past Week   loratadine (CLARITIN) 10 MG tablet Take 10 mg by mouth daily.   12/17/2023 Morning   medroxyPROGESTERone (PROVERA) 5 MG tablet Take 1 tablet (5 mg total) by mouth daily. (Patient taking differently: Take 2.5 mg by mouth daily.) 5 tablet 0 12/17/2023 Morning   methadone (DOLOPHINE) 10 MG/5ML solution Take 41 mg by mouth daily.   12/17/2023 Morning   Scheduled:  ferrous sulfate  325 mg Oral Daily   influenza vac split trivalent PF  0.5 mL  Intramuscular Tomorrow-1000   levothyroxine  112 mcg Oral Q0600   megestrol  80 mg Oral BID   methadone  40 mg Oral Daily   pneumococcal 20-valent conjugate vaccine  0.5 mL Intramuscular Tomorrow-1000   Continuous: WGN:[id number], Muscle Rub Anti-infectives (From admission, onward)    None       Review of Systems: Pertinent items noted in HPI and remainder of comprehensive ROS otherwise negative.  Focused Physical Examination: BP 131/66 (BP Location: Left Arm)   Pulse 83   Temp 98.1 F (36.7 C) (Oral)   Resp 16   Ht 5\' 3"  (1.6 m)   Wt (!) 165.6 kg   LMP 11/26/2023   SpO2 100%   BMI 64.66 kg/m  CONSTITUTIONAL: Well-developed, obese female in no acute distress.  NEUROLOGIC: Alert and oriented to person, place, and time. Normal reflexes, muscle tone coordination. No cranial nerve deficit noted. PSYCHIATRIC: Normal mood and affect. Normal behavior. Normal judgment and  thought content. CARDIOVASCULAR: Normal heart rate noted, regular rhythm RESPIRATORY: Clear to auscultation bilaterally. Effort and breath sounds normal, no problems with respiration noted. ABDOMEN: Soft, normal bowel sounds, no distention noted.  No tenderness, rebound or guarding.  PELVIC: Deferred until office evaluation MUSCULOSKELETAL: Normal range of motion. No tenderness.  No cyanosis, clubbing, or edema.  2+ distal pulses.  Labs and Imaging: Results for orders placed or performed during the hospital encounter of 12/17/23 (from the past 72 hours)  ABO/Rh     Status: None   Collection Time: 12/17/23  7:15 PM  Result Value Ref Range   ABO/RH(D)      O POS Performed at [facility], [address] [place], [place] [address]   CBC with Differential     Status: Abnormal   Collection Time: 12/17/23  7:18 PM  Result Value Ref Range   WBC 6.7 4.0 - 10.5 K/uL   RBC 2.14 (L) 3.87 - 5.11 MIL/uL   Hemoglobin 5.7 (LL) 12.0 - 15.0 g/dL    Comment: REPEATED TO VERIFY THIS CRITICAL RESULT HAS VERIFIED AND BEEN CALLED TO ABIGAIL HARRIS, PA BY SHAY EKDAHL ON 03 04 2025 AT 1942, AND HAS BEEN READ BACK.     HCT 18.6 (L) 36.0 - 46.0 %   MCV 86.9 80.0 - 100.0 fL   MCH 26.2 26.0 - 34.0 pg   MCHC 30.1 30.0 - 36.0 g/dL   RDW 28.4 (H) 13.2 - 44.0 %   Platelets 237 150 - 400 K/uL    Comment: REPEATED TO VERIFY   nRBC 3.6 (H) 0.0 - 0.2 %   Neutrophils Relative % 82 %   Neutro Abs 5.5 1.7 - 7.7 K/uL   Lymphocytes Relative 15 %   Lymphs Abs 1.0 0.7 - 4.0 K/uL   Monocytes Relative 0 %   Monocytes Absolute 0.0 (L) 0.1 - 1.0 K/uL   Eosinophils Relative 3 %   Eosinophils Absolute 0.2 0.0 - 0.5 K/uL   Basophils Relative 0 %   Basophils Absolute 0.0 0.0 - 0.1 K/uL   WBC Morphology See Note     Comment: Mild Left Shift. 1 to 5% Metas, occ myelo   nRBC 2 (H) 0 /100 WBC   Abs Immature Granulocytes 0.00 0.00 - 0.07 K/uL   Polychromasia PRESENT     Comment: Performed at [facility],  [address] [place], [place] [address]  Basic metabolic panel     Status: Abnormal   Collection Time: 12/17/23  7:18 PM  Result Value  Ref Range   Sodium 135 135 - 145 mmol/L   Potassium 3.2 (L) 3.5 - 5.1 mmol/L   Chloride 101 98 - 111 mmol/L   CO2 25 22 - 32 mmol/L   Glucose, Bld 153 (H) 70 - 99 mg/dL    Comment: Glucose reference range applies only to samples taken after fasting for at least 8 hours.   BUN 9 6 - 20 mg/dL   Creatinine, Ser 1.61 0.44 - 1.00 mg/dL   Calcium 8.3 (L) 8.9 - 10.3 mg/dL   GFR, Estimated >09 >60 mL/min    Comment: (NOTE) Calculated using the CKD-EPI Creatinine Equation (2021)    Anion gap 9 5 - 15    Comment: Performed at [facility], [address] [place], [place] [address]  Type and screen     Status: None (Preliminary result)   Collection Time: 12/17/23  7:18 PM  Result Value Ref Range   ABO/RH(D) O POS    Antibody Screen NEG    Sample Expiration 12/20/2023,2359    Unit Number [id number]    Blood Component Type RBC LR PHER1    Unit division 00    Status of Unit ISSUED,FINAL    Transfusion Status OK TO TRANSFUSE    Crossmatch Result Compatible    Unit Number [id number]    Blood Component Type RBC LR PHER2    Unit division 00    Status of Unit ISSUED,FINAL    Transfusion Status OK TO TRANSFUSE    Crossmatch Result Compatible    Unit Number [id number]    Blood Component Type RED CELLS,LR    Unit division 00    Status of Unit ISSUED    Transfusion Status OK TO TRANSFUSE    Crossmatch Result Compatible    Unit Number [id number]    Blood Component Type RED CELLS,LR    Unit division 00    Status of Unit ISSUED    Transfusion Status OK TO TRANSFUSE    Crossmatch Result      Compatible Performed at [facility], [address], [place], [place] [address]   Urinalysis, Routine w reflex microscopic -Urine, Clean Catch     Status: Abnormal   Collection Time: 12/17/23  8:00 PM  Result Value Ref Range   Color, Urine  YELLOW YELLOW   APPearance HAZY (A) CLEAR   Specific Gravity, Urine 1.019 1.005 - 1.030   pH 5.0 5.0 - 8.0   Glucose, UA NEGATIVE NEGATIVE mg/dL   Hgb urine dipstick LARGE (A) NEGATIVE   Bilirubin Urine NEGATIVE NEGATIVE   Ketones, ur NEGATIVE NEGATIVE mg/dL   Protein, ur 30 (A) NEGATIVE mg/dL   Nitrite NEGATIVE NEGATIVE   Leukocytes,Ua NEGATIVE NEGATIVE   RBC / HPF >50 0 - 5 RBC/hpf   WBC, UA 0-5 0 - 5 WBC/hpf   Bacteria, UA RARE (A) NONE SEEN   Squamous Epithelial / HPF 0-5 0 - 5 /HPF   Mucus PRESENT     Comment: Performed at [facility], [address], [place], [place] [address]  Pregnancy, urine     Status: None   Collection Time: 12/17/23  8:00 PM  Result Value Ref Range   Preg Test, Ur NEGATIVE NEGATIVE    Comment:        THE SENSITIVITY OF THIS METHODOLOGY IS >25 mIU/mL. Performed at [facility], [address], [place], [place] [address]   Prepare RBC (crossmatch)     Status: None   Collection  Time: 12/17/23  9:31 PM  Result Value Ref Range   Order Confirmation      ORDER PROCESSED BY BLOOD BANK Performed at [facility], [address] [place], [place] [address]   HIV Antibody (routine testing w rflx)     Status: None   Collection Time: 12/18/23  4:48 AM  Result Value Ref Range   HIV Screen 4th Generation wRfx Non Reactive Non Reactive    Comment: Performed at [facility], [address], [place], [place] [address]  Basic metabolic panel     Status: Abnormal   Collection Time: 12/18/23  4:48 AM  Result Value Ref Range   Sodium 139 135 - 145 mmol/L   Potassium 3.8 3.5 - 5.1 mmol/L   Chloride 101 98 - 111 mmol/L   CO2 28 22 - 32 mmol/L   Glucose, Bld 132 (H) 70 - 99 mg/dL    Comment: Glucose reference range applies only to samples taken after fasting for at least 8 hours.   BUN 11 6 - 20 mg/dL   Creatinine, Ser 9.56 0.44 - 1.00 mg/dL   Calcium 8.5 (L) 8.9 - 10.3 mg/dL   GFR, Estimated >38 >75 mL/min    Comment: (NOTE) Calculated  using the CKD-EPI Creatinine Equation (2021)    Anion gap 10 5 - 15    Comment: Performed at [facility], [address] [place], [place] [address]  CBC     Status: Abnormal   Collection Time: 12/18/23  4:48 AM  Result Value Ref Range   WBC 7.3 4.0 - 10.5 K/uL   RBC 2.57 (L) 3.87 - 5.11 MIL/uL   Hemoglobin 6.8 (LL) 12.0 - 15.0 g/dL    Comment: CRITICAL VALUE NOTED.  VALUE IS CONSISTENT WITH PREVIOUSLY REPORTED AND CALLED VALUE. REPEATED TO VERIFY    HCT 22.4 (L) 36.0 - 46.0 %   MCV 87.2 80.0 - 100.0 fL   MCH 26.5 26.0 - 34.0 pg   MCHC 30.4 30.0 - 36.0 g/dL   RDW 95.1 (H) 88.4 - 16.6 %   Platelets 242 150 - 400 K/uL    Comment: REPEATED TO VERIFY   nRBC 3.1 (H) 0.0 - 0.2 %    Comment: Performed at [facility], [address] [place], [place] [address]  TSH     Status: None   Collection Time: 12/18/23  4:48 AM  Result Value Ref Range   TSH 3.932 0.350 - 4.500 uIU/mL    Comment: Performed by a 3rd Generation assay with a functional sensitivity of <=0.01 uIU/mL. Performed at [facility], [address], [place], [place] [address]   Hemoglobin and hematocrit, blood     Status: Abnormal   Collection Time: 12/18/23  7:31 AM  Result Value Ref Range   Hemoglobin 6.5 (LL) 12.0 - 15.0 g/dL    Comment: CRITICAL VALUE NOTED.  VALUE IS CONSISTENT WITH PREVIOUSLY REPORTED AND CALLED VALUE. REPEATED TO VERIFY    HCT 21.2 (L) 36.0 - 46.0 %    Comment: Performed at [facility], [address], [place], [place] [address]  Hemoglobin and hematocrit, blood     Status: Abnormal   Collection Time: 12/18/23  5:07 PM  Result Value Ref Range   Hemoglobin 7.0 (L) 12.0 - 15.0 g/dL   HCT 73.2 (L) 20.2 - 54.2 %    Comment: Performed at [facility], [address], [place], [place] [address]  Hemoglobin  and hematocrit, blood     Status: Abnormal   Collection Time: 12/19/23  7:10 AM  Result Value Ref Range   Hemoglobin 6.9 (LL) 12.0 - 15.0 g/dL    Comment:  REPEATED TO VERIFY THIS CRITICAL RESULT HAS VERIFIED AND BEEN CALLED TO D. CLAYTON, RN BY LEAH KLAR ON 03 06 2025 AT 0747, AND HAS BEEN READ BACK.     HCT 22.4 (L) 36.0 - 46.0 %    Comment: Performed at [facility], [address] [place], [place] [address]  Prepare RBC (crossmatch)     Status: None   Collection Time: 12/19/23  8:30 AM  Result Value Ref Range   Order Confirmation      ORDER PROCESSED BY BLOOD BANK Performed at [facility], [address], [place], [place] [address]   Hemoglobin and hematocrit, blood     Status: Abnormal   Collection Time: 12/19/23  6:27 PM  Result Value Ref Range   Hemoglobin 8.6 (L) 12.0 - 15.0 g/dL   HCT 82.9 (L) 56.2 - 13.0 %    Comment: Performed at [facility], [address], [place], [place] [address]  Hemoglobin and hematocrit, blood     Status: Abnormal   Collection Time: 12/20/23  6:32 AM  Result Value Ref Range   Hemoglobin 9.0 (L) 12.0 - 15.0 g/dL   HCT 46.9 (L) 62.9 - 52.8 %    Comment: Performed at [facility], [address] [place], [place] [address]    US PELVIC COMPLETE WITH TRANSVAGINAL Result Date: 12/19/2023 CLINICAL DATA:  Abnormal uterine bleeding EXAM: TRANSABDOMINAL AND TRANSVAGINAL ULTRASOUND OF PELVIS TECHNIQUE: Both transabdominal and transvaginal ultrasound examinations of the pelvis were performed. Transabdominal technique was performed for global imaging of the pelvis including uterus, ovaries, adnexal regions, and pelvic cul-de-sac. It was necessary to proceed with endovaginal exam following the transabdominal exam to visualize the ovaries. COMPARISON:  None Available. FINDINGS: Uterus Measurements: 10.7 x 5.4 x 5.1 cm. = volume: 156 mL. No fibroids or other mass visualized. Endometrium Thickness: 8.4 mm.  No focal abnormality visualized. Right ovary Measurements: 5.1 x 3.2 x 3.8 cm. = volume: 32.3 mL. 3 cm simple right ovarian cyst is noted. Left ovary Measurements: 3.1 x 2.3 x 2.7 cm. = volume:  9.8 mL. Normal appearance/no adnexal mass. Other findings No abnormal free fluid. IMPRESSION: Right ovarian follicle measuring 3 cm, normal finding. No follow-up imaging is recommended. Reference: Radiology 2019 Nov;293(2):359-371 No other focal abnormality is noted. Electronically Signed   By: Alcide Clever M.D.   On: 12/19/2023 21:57

## 2023-12-20 ENCOUNTER — Encounter (HOSPITAL_COMMUNITY): Payer: Self-pay | Admitting: Internal Medicine

## 2023-12-20 ENCOUNTER — Other Ambulatory Visit (HOSPITAL_COMMUNITY): Payer: Self-pay

## 2023-12-20 DIAGNOSIS — Z6841 Body Mass Index (BMI) 40.0 and over, adult: Secondary | ICD-10-CM

## 2023-12-20 DIAGNOSIS — N939 Abnormal uterine and vaginal bleeding, unspecified: Secondary | ICD-10-CM

## 2023-12-20 LAB — BPAM RBC
Blood Product Expiration Date: 202503212359
Blood Product Expiration Date: 202503212359
Blood Product Expiration Date: 202503272359
Blood Product Expiration Date: 202503272359
ISSUE DATE / TIME: 202503042306
ISSUE DATE / TIME: 202503050050
ISSUE DATE / TIME: 202503060921
ISSUE DATE / TIME: 202503061143
Unit Type and Rh: 5100
Unit Type and Rh: 5100
Unit Type and Rh: 5100
Unit Type and Rh: 5100

## 2023-12-20 LAB — TYPE AND SCREEN
ABO/RH(D): O POS
Antibody Screen: NEGATIVE
Unit division: 0
Unit division: 0
Unit division: 0
Unit division: 0

## 2023-12-20 LAB — HEMOGLOBIN AND HEMATOCRIT, BLOOD
HCT: 28.1 % — ABNORMAL LOW (ref 36.0–46.0)
Hemoglobin: 9 g/dL — ABNORMAL LOW (ref 12.0–15.0)

## 2023-12-20 MED ORDER — MEGESTROL ACETATE 40 MG/ML PO SUSP
80.0000 mg | Freq: Two times a day (BID) | ORAL | 2 refills | Status: AC
  Filled 2023-12-20: qty 120, 30d supply, fill #0

## 2023-12-20 NOTE — Discharge Summary (Signed)
 Physician Discharge Summary  Catherine Nelson ZOX:[id number] DOB: 15-Nov-1985 DOA: 12/17/2023  PCP: [name], NP  Admit date: 12/17/2023 Discharge date: 12/20/2023  Admitted From: Home  Discharge disposition: Home   Recommendations for Outpatient Follow-Up:   Follow up with your primary care provider in one week.  Check CBC, BMP, magnesium in the next visit Follow-up with your gynecologist as scheduled by the clinic.    Discharge Diagnosis:   Principal Problem:   Abnormal uterine bleeding (AUB) Active Problems:   Symptomatic anemia   Hypothyroidism   Depression   Methadone maintenance therapy patient (HCC)   Morbid obesity with BMI of 60.0-69.9, adult [facility])    Discharge Condition: Improved.  Diet recommendation:   Regular.  Wound care: None.  Code status: Full.   History of Present Illness:   Catherine Nelson is a 38 y.o. female with history of partial thyroidectomy for thyroid cancer on thyroid replacement therapy, depression, chronic methadone therapy presented to hospital with severe vaginal bleeding for last 3 weeks.  Patient had presented to ED on  12/04/2023 and patient was prescribed 5 mg Provera but despite taking Provera she was continuing to have vaginal bleeding which was heavy.  She then went to see her primary care physician 5 days ago and was given another prescription.  Despite that patient continued to have vaginal bleeding and had fatigue weakness and some dizziness on exertion.  States that that she does have a strong family history of vaginal bleeding to the point that family was had to go through hysterectomy.  Does not have insurance at this time to follow-up with gynecologist.  In the ED, patient was noted to have hemoglobin of 5.7. ER physician discussed with on-call OB/GYN Dr. Dr. Jolayne Panther who advised starting patient on Megace 40 mg and since patient is symptomatic 2 units of PRBC transfusion was ordered.  Patient was then admitted to the hospital for  further observation.    Hospital Course:   Following conditions were addressed during hospitalization as listed below,  Symptomatic anemia secondary to severe menorrhagia -    Hemoglobin on presentation at 6.9 and received 2 units of PRBC transfusion.  Patient required additional 2 units of blood PRBC during hospitalization.  Gynecology was consulted and patient was started on Megace 80 mg twice daily which will be continued on discharge and decreased to 40 mg twice daily until seen by gynecologist.  At this time patient has minimal vaginal bleeding and her latest hemoglobin is 9.0.  She feels much improved.  She was advised to follow-up with gynecology as outpatient.  [name] is a clinic to schedule an appointment with her.  Patient encouraged to continue to take iron as outpatient.  Hypothyroidism with prior history of partial thyroidectomy for thyroid cancer, continue  Synthroid.  TSH within normal range at 3 point   Depression on antidepressant.  Not listed in the med rec.   On chronic methadone therapy. Continue on methadone while in the hospital.  Patient gets methadone from the clinic.  Will resume on discharge.   Mild hypokalemia improved after replacement.  Latest potassium of 3.8.   Class III obesity.Body mass index is 64.66 kg/m.  Would benefit from ongoing weight loss as outpatient.  Disposition.  At this time, patient is stable for disposition home with outpatient PCP and gynecology follow-up.  Communicated with gynecology prior to discharge.  Medical Consultants:   Gynecology  Procedures:    PRBC transfusion 4 units Subjective:   Today, patient was seen  and examined at bedside.  Feels much improved.  Vaginal bleeding has significantly improved.  No other symptoms.  Discharge Exam:   Vitals:   12/20/23 0433 12/20/23 0738  BP: (!) 94/52 131/66  Pulse: 81 83  Resp: 16 16  Temp: 98.6 F (37 C) 98.1 F (36.7 C)  SpO2: 100% 100%   Vitals:   12/19/23 1550  12/19/23 2010 12/20/23 0433 12/20/23 0738  BP: 125/63 129/66 (!) 94/52 131/66  Pulse: 80 88 81 83  Resp: 18 17 16 16   Temp: 98.3 F (36.8 C) 98.6 F (37 C) 98.6 F (37 C) 98.1 F (36.7 C)  TempSrc: Oral Oral Oral Oral  SpO2: 96% 98% 100% 100%  Weight:      Height:       Body mass index is 64.66 kg/m.  General: Alert awake, not in obvious distress, obese. HENT: pupils equally reacting to light, mild pallor noted.  Oral mucosa is moist.  Chest:  Clear breath sounds.o crackles or wheezes.  CVS: S1 &S2 heard. No murmur.  Regular rate and rhythm. Abdomen: Soft, nontender, nondistended.  Bowel sounds are heard.   Extremities: No cyanosis, clubbing or edema.  Peripheral pulses are palpable. Psych: Alert, awake and oriented, normal mood CNS:  No cranial nerve deficits.  Power equal in all extremities.   Skin: Warm and dry.  No rashes noted.  The results of significant diagnostics from this hospitalization (including imaging, microbiology, ancillary and laboratory) are listed below for reference.     Diagnostic Studies:   No results found.   Labs:   Basic Metabolic Panel: Recent Labs  Lab 12/17/23 1918 12/18/23 0448  NA 135 139  K 3.2* 3.8  CL 101 101  CO2 25 28  GLUCOSE 153* 132*  BUN 9 11  CREATININE 0.78 0.74  CALCIUM 8.3* 8.5*   GFR Estimated Creatinine Clearance: 151.4 mL/min (by C-G formula based on SCr of 0.74 mg/dL). Liver Function Tests: No results for input(s): "AST", "ALT", "ALKPHOS", "BILITOT", "PROT", "ALBUMIN" in the last 168 hours. No results for input(s): "LIPASE", "AMYLASE" in the last 168 hours. No results for input(s): "AMMONIA" in the last 168 hours. Coagulation profile No results for input(s): "INR", "PROTIME" in the last 168 hours.  CBC: Recent Labs  Lab 12/17/23 1918 12/18/23 0448 12/18/23 0731 12/18/23 1707 12/19/23 0710 12/19/23 1827 12/20/23 0632  WBC 6.7 7.3  --   --   --   --   --   NEUTROABS 5.5  --   --   --   --   --   --    HGB 5.7* 6.8* 6.5* 7.0* 6.9* 8.6* 9.0*  HCT 18.6* 22.4* 21.2* 22.3* 22.4* 26.8* 28.1*  MCV 86.9 87.2  --   --   --   --   --   PLT 237 242  --   --   --   --   --    Cardiac Enzymes: No results for input(s): "CKTOTAL", "CKMB", "CKMBINDEX", "TROPONINI" in the last 168 hours. BNP: Invalid input(s): "POCBNP" CBG: No results for input(s): "GLUCAP" in the last 168 hours. D-Dimer No results for input(s): "DDIMER" in the last 72 hours. Hgb A1c No results for input(s): "HGBA1C" in the last 72 hours. Lipid Profile No results for input(s): "CHOL", "HDL", "LDLCALC", "TRIG", "CHOLHDL", "LDLDIRECT" in the last 72 hours. Thyroid function studies Recent Labs    12/18/23 0448  TSH 3.932   Anemia work up No results for input(s): "VITAMINB12", "FOLATE", "FERRITIN", "TIBC", "IRON", "RETICCTPCT"  in the last 72 hours. Microbiology No results found for this or any previous visit (from the past 240 hours).   Discharge Instructions:   Discharge Instructions     Diet general   Complete by: As directed    Discharge instructions   Complete by: As directed    Follow-up with your primary care provider in 1 week.  Check blood work at that time.  Take iron and hormone treatment as prescribed.  Follow-up with gynecology office as scheduled by the clinic.  Seek medical attention for worsening symptoms.  Decrease the dose of Megace to 40 mg twice a day if you experience less or no  bleeding and until seen in the office of gynecology.   Increase activity slowly   Complete by: As directed       Allergies as of 12/20/2023   No Known Allergies      Medication List     STOP taking these medications    medroxyPROGESTERone 5 MG tablet Commonly known as: PROVERA       TAKE these medications    ferrous sulfate 325 (65 FE) MG tablet Take 1 tablet (325 mg total) by mouth daily.   ibuprofen 800 MG tablet Commonly known as: ADVIL Take 800 mg by mouth every 8 (eight) hours as needed for mild pain  (pain score 1-3).   levothyroxine 112 MCG tablet Commonly known as: SYNTHROID Take 112 mcg by mouth daily before breakfast.   loperamide 2 MG tablet Commonly known as: Imodium A-D Take 1 tablet (2 mg total) by mouth 4 (four) times daily as needed for diarrhea or loose stools.   loratadine 10 MG tablet Commonly known as: CLARITIN Take 10 mg by mouth daily.   megestrol 40 MG/ML suspension Commonly known as: MEGACE Take 2 mLs (80 mg total) by mouth 2 (two) times daily.   methadone 10 MG/5ML solution Commonly known as: DOLOPHINE Take 41 mg by mouth daily.        Follow-up Information     Placey, Chales Abrahams, NP Follow up in 1 week(s).   Contact information: [address] [place] [address] [phone]         [facility] Follow up in 3 week(s).   Specialty: Obstetrics and Gynecology Why: You will be called with appointment date and time for your follow up appointment soon Contact information: [address] [place] [address] [phone]                 Time coordinating discharge: 39 minutes  Signed:  Clois Montavon  [facility] 12/20/2023, 11:43 AM

## 2023-12-20 NOTE — Plan of Care (Signed)

## 2023-12-20 NOTE — Progress Notes (Signed)
 Discharge instructions (including medications) discussed with and copy provided to patient, she verbalized understanding. PIV removed and patient dressed herself.  Husband is 10 minutes away and TOC medications picked up at discharge.

## 2024-01-08 ENCOUNTER — Encounter: Admitting: Family Medicine

## 2024-01-08 NOTE — Progress Notes (Deleted)
   GYNECOLOGY PROBLEM  VISIT ENCOUNTER NOTE  Subjective:   Catherine Nelson is a 38 y.o. [id number] female here for a problem GYN visit.  - history of partial thyroidectomy for thyroid cancer on thyroid replacement therapy, depression, chronic methadone therapy has been experiencing severe vaginal bleeding for that necessitated admission and transfusions. Discharged on 3/7 after 2 units pRBC and was on megace.     Denies abnormal vaginal bleeding, discharge, pelvic pain, problems with intercourse or other gynecologic concerns.    Gynecologic History No LMP recorded.  Contraception: {method:5051}  Health Maintenance Due  Topic Date Due   COVID-19 Vaccine (1) Never done   Pneumococcal Vaccine 34-45 Years old (1 of 2 - PCV) Never done   Hepatitis C Screening  Never done   DTaP/Tdap/Td (1 - Tdap) Never done   Cervical Cancer Screening (HPV/Pap Cotest)  Never done   INFLUENZA VACCINE  05/16/2023    The following portions of the patient's history were reviewed and updated as appropriate: allergies, current medications, past family history, past medical history, past social history, past surgical history and problem list.  Review of Systems Pertinent items are noted in HPI.   Objective:  There were no vitals taken for this visit. Gen: well appearing, NAD HEENT: no scleral icterus CV: RR Lung: Normal WOB Ext: warm well perfused  PELVIC: Normal appearing external genitalia; normal appearing vaginal mucosa and cervix.  No abnormal discharge noted.  ***Pap smear obtained.  Normal uterine size, no other palpable masses, no uterine or adnexal tenderness.   Assessment and Plan:  1. Abnormal uterine bleeding (AUB) (Primary) ***   Please refer to After Visit Summary for other counseling recommendations.   No follow-ups on file.  Federico Flake, MD, MPH, ABFM Attending Physician Faculty Practice- Center for [facility]

## 2024-02-20 ENCOUNTER — Encounter: Admitting: Obstetrics and Gynecology

## 2024-04-14 ENCOUNTER — Other Ambulatory Visit (HOSPITAL_COMMUNITY): Payer: Self-pay

## 2024-06-22 ENCOUNTER — Ambulatory Visit (HOSPITAL_BASED_OUTPATIENT_CLINIC_OR_DEPARTMENT_OTHER): Admitting: Family Medicine

## 2024-08-07 ENCOUNTER — Telehealth: Payer: Self-pay | Admitting: Nurse Practitioner

## 2024-08-07 DIAGNOSIS — J302 Other seasonal allergic rhinitis: Secondary | ICD-10-CM

## 2024-08-07 DIAGNOSIS — N946 Dysmenorrhea, unspecified: Secondary | ICD-10-CM

## 2024-08-07 DIAGNOSIS — E039 Hypothyroidism, unspecified: Secondary | ICD-10-CM

## 2024-08-07 DIAGNOSIS — F339 Major depressive disorder, recurrent, unspecified: Secondary | ICD-10-CM

## 2024-08-07 MED ORDER — LORATADINE 10 MG PO TABS: 10.0000 mg | ORAL_TABLET | Freq: Every day | ORAL | 0 refills | Status: DC

## 2024-08-07 MED ORDER — IBUPROFEN 800 MG PO TABS: 800.0000 mg | ORAL_TABLET | Freq: Three times a day (TID) | ORAL | 0 refills | Status: DC | PRN

## 2024-08-07 MED ORDER — FLUOXETINE HCL 40 MG PO CAPS: 40.0000 mg | ORAL_CAPSULE | Freq: Every day | ORAL | 0 refills | Status: DC

## 2024-08-07 MED ORDER — LEVOTHYROXINE SODIUM 125 MCG PO TABS: 125.0000 ug | ORAL_TABLET | Freq: Every day | ORAL | 0 refills | Status: DC

## 2024-08-07 NOTE — Progress Notes (Signed)
 Acute Video Visit    Virtual Visit Consent:   [name], you are scheduled for a virtual visit with a [facility] provider today.     Just as with appointments in the office, your consent must be obtained to participate.  Your consent will be active for this visit and any virtual visit you may have with one of our providers in the next 365 days.     If you have a MyChart account, a copy of this consent can be sent to you electronically.  All virtual visits are billed to your insurance company just like a traditional visit in the office.    If the connection with a video visit is poor, the visit may have to be switched to a telephone visit.  With either a video or telephone visit, we are not always able to ensure that we have a secure connection.     I need to obtain your verbal consent now.   Are you willing to proceed with your visit today?    [name] has provided verbal consent on 08/07/2024 for a virtual visit (video or telephone).   [name], FNP  Date: 08/07/2024 1:12 PM  Subjective:     Patient ID: Catherine Nelson, female    DOB: 24-Dec-1985, 38 y.o.   MRN: [id number]  LILLETTE [name], connected with  Najmo Pardue  ([id number], [birth year]) on 08/07/24 at 11:00 AM EDT by a video-enabled telemedicine application and verified that I am speaking with the correct person using two identifiers.   Location: Patient: [facility]  Provider: Virtual Visit Location Provider: Home Office   I discussed the limitations of evaluation and management by telemedicine and the availability of in person appointments. The patient expressed understanding and agreed to proceed.     HPI Catherine Nelson is a 38 y.o. who identifies as a female who was assigned female at birth, and is being seen today for assistance with medication refills   She is a patient at the [facility] under the care of [name] she will be transitioning to [facility] next month and is requesting a 30 day  refill on her medications until she can be seen next month.  Denies any acute needs today  Medical history is significant for hypothyroidism, abnormal uterine bleeding and menstraul cramping, and depression  Denies any acute needs today       Objective:     Physical Exam Constitutional:      General: She is not in acute distress.    Appearance: Normal appearance.  HENT:     Nose: Nose normal.  Pulmonary:     Effort: Pulmonary effort is normal.  Neurological:     Mental Status: She is alert and oriented to person, place, and time.  Psychiatric:        Mood and Affect: Mood normal.     No results found for any visits on 08/07/24.      Assessment & Plan:   Encouraged to use VPC while awaiting new patient appointment next month  1. Depression, recurrent (Primary)  - FLUoxetine (PROZAC) 40 MG capsule; Take 1 capsule (40 mg total) by mouth daily.  Dispense: 30 capsule; Refill: 0  2. Hypothyroidism, unspecified type  - levothyroxine  (SYNTHROID ) 125 MCG tablet; Take 1 tablet (125 mcg total) by mouth daily before breakfast.  Dispense: 30 tablet; Refill: 0  3. Seasonal allergies  - loratadine (CLARITIN) 10 MG tablet; Take 1 tablet (10 mg total) by mouth daily.  Dispense: 30 tablet; Refill: 0  4. Menstrual cramps  - ibuprofen (ADVIL) 800 MG tablet; Take 1 tablet (800 mg total) by mouth every 8 (eight) hours as needed for mild pain (pain score 1-3).  Dispense: 30 tablet; Refill: 0   Follow Up Instructions: I discussed the assessment and treatment plan with the patient. The patient was provided an opportunity to ask questions and all were answered. The patient agreed with the plan and demonstrated an understanding of the instructions.  A copy of instructions were sent to the patient via MyChart unless otherwise noted below.     The patient was advised to call back or seek an in-person evaluation if the symptoms worsen or if the condition fails to improve as  anticipated.    [name], FNP  **Disclaimer: This note may have been dictated with voice recognition software. Similar sounding words can inadvertently be transcribed and this note may contain transcription errors which may not have been corrected upon publication of note.**

## 2024-08-14 ENCOUNTER — Encounter: Payer: Self-pay | Admitting: Obstetrics and Gynecology

## 2024-09-08 ENCOUNTER — Ambulatory Visit (HOSPITAL_BASED_OUTPATIENT_CLINIC_OR_DEPARTMENT_OTHER): Admitting: Family Medicine

## 2024-10-20 ENCOUNTER — Other Ambulatory Visit: Payer: Self-pay | Admitting: *Deleted

## 2024-10-20 DIAGNOSIS — F339 Major depressive disorder, recurrent, unspecified: Secondary | ICD-10-CM

## 2024-10-20 DIAGNOSIS — J302 Other seasonal allergic rhinitis: Secondary | ICD-10-CM

## 2024-10-20 DIAGNOSIS — N946 Dysmenorrhea, unspecified: Secondary | ICD-10-CM

## 2024-10-20 DIAGNOSIS — E039 Hypothyroidism, unspecified: Secondary | ICD-10-CM

## 2024-10-20 MED ORDER — FLUOXETINE HCL 40 MG PO CAPS: 40.0000 mg | ORAL_CAPSULE | Freq: Every day | ORAL | 0 refills | Status: AC

## 2024-10-20 MED ORDER — LORATADINE 10 MG PO TABS: 10.0000 mg | ORAL_TABLET | Freq: Every day | ORAL | 0 refills | Status: DC

## 2024-10-20 MED ORDER — FLUOXETINE HCL 40 MG PO CAPS: 40.0000 mg | ORAL_CAPSULE | Freq: Every day | ORAL | 0 refills | Status: DC

## 2024-10-20 MED ORDER — IBUPROFEN 800 MG PO TABS: 800.0000 mg | ORAL_TABLET | Freq: Three times a day (TID) | ORAL | 0 refills | Status: DC | PRN

## 2024-10-20 MED ORDER — IBUPROFEN 800 MG PO TABS: 800.0000 mg | ORAL_TABLET | Freq: Three times a day (TID) | ORAL | 0 refills | Status: AC | PRN

## 2024-10-20 MED ORDER — LEVOTHYROXINE SODIUM 125 MCG PO TABS: 125.0000 ug | ORAL_TABLET | Freq: Every day | ORAL | 0 refills | Status: AC

## 2024-10-20 MED ORDER — LORATADINE 10 MG PO TABS: 10.0000 mg | ORAL_TABLET | Freq: Every day | ORAL | 0 refills | Status: AC

## 2024-10-20 MED ORDER — LEVOTHYROXINE SODIUM 125 MCG PO TABS: 125.0000 ug | ORAL_TABLET | Freq: Every day | ORAL | 0 refills | Status: DC

## 2024-10-20 NOTE — Progress Notes (Signed)
 Telehealth visit for monthly refills as she is working. She thinks she has an umbilical hernia.  She is also constipated (multifactorial).  She takes Mira lax and tries to drink more water. Otherwise just trying to take better care of herself this year. Ok for refills she requested. Ok for 30 day FU

## 2024-10-23 NOTE — Progress Notes (Unsigned)
 The patient presented for a primary care visit on 08/07/2024. Vitals, labs, and SDOH screening was not conducted due to this being a video visit.   A review of the patient's chart revealed that they do not currently have a primary care provider (PCP) and no future appointments were indicated.   Call Attempt #1: CHW called pt to obtain insurance and SDOH status. Call could not be completed to reach the pt due to it being invalid.   Call Attempt #2: CHW called pt again. Call could not be completed to the pt due to this being an invalid #.   At this time, no additional support from the Health Equity team is necessary. /An additional follow up will be done according to the health equity team's protocol.
